# Patient Record
Sex: Female | Born: 1965 | Race: White | Hispanic: No | Marital: Married | Smoking: Never smoker
Health system: Southern US, Community
[De-identification: ages and names within clinical notes are randomized; demographics above are authoritative.]

## PROBLEM LIST (undated history)

## (undated) DIAGNOSIS — D691 Qualitative platelet defects: Secondary | ICD-10-CM

## (undated) DIAGNOSIS — D649 Anemia, unspecified: Secondary | ICD-10-CM

## (undated) DIAGNOSIS — N200 Calculus of kidney: Secondary | ICD-10-CM

## (undated) DIAGNOSIS — B009 Herpesviral infection, unspecified: Secondary | ICD-10-CM

## (undated) DIAGNOSIS — Z9289 Personal history of other medical treatment: Secondary | ICD-10-CM

## (undated) DIAGNOSIS — M1990 Unspecified osteoarthritis, unspecified site: Secondary | ICD-10-CM

## (undated) DIAGNOSIS — Z8489 Family history of other specified conditions: Secondary | ICD-10-CM

## (undated) DIAGNOSIS — B019 Varicella without complication: Secondary | ICD-10-CM

## (undated) HISTORY — DX: Varicella without complication: B01.9

## (undated) HISTORY — DX: Qualitative platelet defects: D69.1

## (undated) HISTORY — DX: Unspecified osteoarthritis, unspecified site: M19.90

## (undated) HISTORY — DX: Herpesviral infection, unspecified: B00.9

## (undated) HISTORY — DX: Calculus of kidney: N20.0

## (undated) HISTORY — PX: FOOT SURGERY: SHX648

## (undated) HISTORY — DX: Personal history of other medical treatment: Z92.89

---
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
## 2007-05-09 ENCOUNTER — Emergency Department: Payer: Self-pay | Admitting: Emergency Medicine

## 2007-06-19 ENCOUNTER — Ambulatory Visit: Payer: Self-pay

## 2008-07-01 ENCOUNTER — Ambulatory Visit: Payer: Self-pay

## 2009-07-05 ENCOUNTER — Ambulatory Visit: Payer: Self-pay

## 2010-07-27 ENCOUNTER — Ambulatory Visit: Payer: Self-pay | Admitting: Podiatry

## 2012-05-02 ENCOUNTER — Ambulatory Visit: Payer: Self-pay | Admitting: Internal Medicine

## 2012-05-02 LAB — IRON AND TIBC
Iron Bind.Cap.(Total): 322 ug/dL (ref 250–450)
Iron: 146 ug/dL (ref 50–170)
Unbound Iron-Bind.Cap.: 176 ug/dL

## 2012-05-02 LAB — CBC CANCER CENTER
Eosinophil #: 0.3 x10 3/mm (ref 0.0–0.7)
Eosinophil %: 3.2 %
HCT: 35.5 % (ref 35.0–47.0)
HGB: 12.1 g/dL (ref 12.0–16.0)
Lymphocyte %: 38.2 %
MCH: 32.4 pg (ref 26.0–34.0)
MCHC: 34 g/dL (ref 32.0–36.0)
MCV: 96 fL (ref 80–100)
Monocyte %: 11 %
Neutrophil %: 46.4 %
RBC: 3.72 10*6/uL — ABNORMAL LOW (ref 3.80–5.20)
RDW: 12.7 % (ref 11.5–14.5)
WBC: 8.4 x10 3/mm (ref 3.6–11.0)

## 2012-05-02 LAB — FERRITIN: Ferritin (ARMC): 73 ng/mL (ref 8–388)

## 2012-05-06 ENCOUNTER — Ambulatory Visit: Payer: Self-pay | Admitting: Internal Medicine

## 2012-05-30 ENCOUNTER — Ambulatory Visit: Payer: Self-pay | Admitting: Obstetrics and Gynecology

## 2012-06-05 ENCOUNTER — Ambulatory Visit: Payer: Self-pay | Admitting: Internal Medicine

## 2012-07-06 ENCOUNTER — Ambulatory Visit: Payer: Self-pay | Admitting: Internal Medicine

## 2012-07-17 LAB — CBC CANCER CENTER
Basophil #: 0.1 x10 3/mm (ref 0.0–0.1)
Basophil %: 1.4 %
Eosinophil %: 4.5 %
HCT: 35.2 % (ref 35.0–47.0)
HGB: 12 g/dL (ref 12.0–16.0)
Lymphocyte #: 3.4 x10 3/mm (ref 1.0–3.6)
Lymphocyte %: 43.6 %
MCH: 32.9 pg (ref 26.0–34.0)
MCHC: 34.2 g/dL (ref 32.0–36.0)
Monocyte #: 0.8 x10 3/mm (ref 0.2–0.9)
Monocyte %: 10.1 %
Neutrophil #: 3.1 x10 3/mm (ref 1.4–6.5)
Neutrophil %: 40.4 %
Platelet: 372 x10 3/mm (ref 150–440)
WBC: 7.8 x10 3/mm (ref 3.6–11.0)

## 2012-08-05 ENCOUNTER — Ambulatory Visit: Payer: Self-pay | Admitting: Internal Medicine

## 2014-02-15 ENCOUNTER — Ambulatory Visit: Payer: Self-pay | Admitting: Obstetrics and Gynecology

## 2014-12-21 ENCOUNTER — Other Ambulatory Visit: Payer: Self-pay | Admitting: Obstetrics and Gynecology

## 2014-12-21 DIAGNOSIS — Z1231 Encounter for screening mammogram for malignant neoplasm of breast: Secondary | ICD-10-CM

## 2015-02-23 ENCOUNTER — Ambulatory Visit
Admission: RE | Admit: 2015-02-23 | Discharge: 2015-02-23 | Disposition: A | Discharge status: Home / Self Care | Payer: BLUE CROSS/BLUE SHIELD | Source: Ambulatory Visit | Attending: Obstetrics and Gynecology | Admitting: Obstetrics and Gynecology

## 2015-02-23 DIAGNOSIS — Z1231 Encounter for screening mammogram for malignant neoplasm of breast: Secondary | ICD-10-CM | POA: Insufficient documentation

## 2016-07-31 DIAGNOSIS — Z124 Encounter for screening for malignant neoplasm of cervix: Secondary | ICD-10-CM | POA: Diagnosis not present

## 2016-07-31 DIAGNOSIS — Z01419 Encounter for gynecological examination (general) (routine) without abnormal findings: Secondary | ICD-10-CM | POA: Diagnosis not present

## 2016-08-01 ENCOUNTER — Other Ambulatory Visit: Payer: Self-pay | Admitting: Obstetrics and Gynecology

## 2016-08-01 DIAGNOSIS — Z1231 Encounter for screening mammogram for malignant neoplasm of breast: Secondary | ICD-10-CM

## 2016-08-14 DIAGNOSIS — Z131 Encounter for screening for diabetes mellitus: Secondary | ICD-10-CM | POA: Diagnosis not present

## 2016-08-14 DIAGNOSIS — Z1321 Encounter for screening for nutritional disorder: Secondary | ICD-10-CM | POA: Diagnosis not present

## 2016-08-14 DIAGNOSIS — Z136 Encounter for screening for cardiovascular disorders: Secondary | ICD-10-CM | POA: Diagnosis not present

## 2016-08-14 DIAGNOSIS — Z13220 Encounter for screening for lipoid disorders: Secondary | ICD-10-CM | POA: Diagnosis not present

## 2016-08-14 DIAGNOSIS — Z1329 Encounter for screening for other suspected endocrine disorder: Secondary | ICD-10-CM | POA: Diagnosis not present

## 2016-08-24 ENCOUNTER — Ambulatory Visit
Admission: RE | Admit: 2016-08-24 | Discharge: 2016-08-24 | Disposition: A | Discharge status: Home / Self Care | Payer: BLUE CROSS/BLUE SHIELD | Source: Ambulatory Visit | Attending: Obstetrics and Gynecology | Admitting: Obstetrics and Gynecology

## 2016-08-24 DIAGNOSIS — Z1231 Encounter for screening mammogram for malignant neoplasm of breast: Secondary | ICD-10-CM | POA: Diagnosis not present

## 2017-03-19 DIAGNOSIS — M530 Cervicocranial syndrome: Secondary | ICD-10-CM | POA: Diagnosis not present

## 2017-03-19 DIAGNOSIS — M533 Sacrococcygeal disorders, not elsewhere classified: Secondary | ICD-10-CM | POA: Diagnosis not present

## 2017-03-19 DIAGNOSIS — M9903 Segmental and somatic dysfunction of lumbar region: Secondary | ICD-10-CM | POA: Diagnosis not present

## 2017-03-26 DIAGNOSIS — M9903 Segmental and somatic dysfunction of lumbar region: Secondary | ICD-10-CM | POA: Diagnosis not present

## 2017-03-26 DIAGNOSIS — M533 Sacrococcygeal disorders, not elsewhere classified: Secondary | ICD-10-CM | POA: Diagnosis not present

## 2017-05-06 DIAGNOSIS — M222X9 Patellofemoral disorders, unspecified knee: Secondary | ICD-10-CM | POA: Diagnosis not present

## 2017-06-26 ENCOUNTER — Other Ambulatory Visit: Payer: Self-pay | Admitting: Orthopedic Surgery

## 2017-07-10 ENCOUNTER — Other Ambulatory Visit: Payer: Self-pay

## 2017-07-10 ENCOUNTER — Encounter
Admission: RE | Admit: 2017-07-10 | Discharge: 2017-07-10 | Disposition: A | Discharge status: Home / Self Care | Payer: BLUE CROSS/BLUE SHIELD | Source: Ambulatory Visit | Attending: Orthopedic Surgery | Admitting: Orthopedic Surgery

## 2017-07-10 HISTORY — DX: Family history of other specified conditions: Z84.89

## 2017-07-10 HISTORY — DX: Anemia, unspecified: D64.9

## 2017-07-10 NOTE — Patient Instructions (Signed)
Your procedure is scheduled on: 07-16-17 TUESDAY Report to Same Day Surgery 2nd floor medical mall [facility](Medical Mall Entrance-take elevator on left to 2nd floor.  Check in with surgery information desk.) To find out your arrival time please call [phone] between 1PM - 3PM on 07-15-17 MONDAY  Remember: Instructions that are not followed completely may result in serious medical risk, up to and including death, or upon the discretion of your surgeon and anesthesiologist your surgery may need to be rescheduled.    _x___ 1. Do not eat food after midnight the night before your procedure. NO GUM OR CANDY AFTER MIDNIGHT.  You may drink clear liquids up to 2 hours before you are scheduled to arrive at the hospital for your procedure.  Do not drink clear liquids within 2 hours of your scheduled arrival to the hospital.  Clear liquids include  --Water or Apple juice without pulp  --Clear carbohydrate beverage such as ClearFast or Gatorade  --Black Coffee or Clear Tea (No milk, no creamers, do not add anything to the coffee or Tea     __x__ 2. No Alcohol for 24 hours before or after surgery.   __x__3. No Smoking or e-cigarettes for 24 prior to surgery.  Do not use any chewable tobacco products for at least 6 hour prior to surgery   ____  4. Bring all medications with you on the day of surgery if instructed.    __x__ 5. Notify your doctor if there is any change in your medical condition     (cold, fever, infections).    x___6. On the morning of surgery brush your teeth with toothpaste and water.  You may rinse your mouth with mouth wash if you wish.  Do not swallow any toothpaste or mouthwash.   Do not wear jewelry, make-up, hairpins, clips or nail polish.  Do not wear lotions, powders, or perfumes. You may wear deodorant.  Do not shave 48 hours prior to surgery. Men may shave face and neck.  Do not bring valuables to the hospital.    [facility] responsible for any belongings or  valuables.               Contacts, dentures or bridgework may not be worn into surgery.  Leave your suitcase in the car. After surgery it may be brought to your room.  For patients admitted to the hospital, discharge time is determined by your treatment team.  _  Patients discharged the day of surgery will not be allowed to drive home.  You will need someone to drive you home and stay with you the night of your procedure.    Please read over the following fact sheets that you were given:   [facility] Pre[facility] and or MRSA Information   ____ Take anti-hypertensive listed below, cardiac, seizure, asthma, anti-reflux and psychiatric medicines. These include:  1. NONE  2.  3.  4.  5.  6.  ____Fleets enema or Magnesium Citrate as directed.   _x___ Use CHG Soap or sage wipes as directed on instruction sheet   ____ Use inhalers on the day of surgery and bring to hospital day of surgery  ____ Stop Metformin and Janumet 2 days prior to surgery.    ____ Take 1/2 of usual insulin dose the night before surgery and none on the morning surgery.   ____ Follow recommendations from Cardiologist, Pulmonologist or PCP regarding stopping Aspirin, Coumadin, Plavix ,Eliquis, Effient, or Pradaxa, and Pletal.  X____Stop  Anti-inflammatories such as Advil, Aleve, Ibuprofen, Motrin, Naproxen, Naprosyn, Goodies powders or aspirin products NOW-OK to take Tylenol    _x___ Stop supplements until after surgery-STOP GLUCOSAMINE NOW-MAY RESUME AFTER SURGERY   ____ Bring C-Pap to the hospital.

## 2017-07-11 ENCOUNTER — Encounter
Admission: RE | Admit: 2017-07-11 | Discharge: 2017-07-11 | Disposition: A | Discharge status: Home / Self Care | Payer: BLUE CROSS/BLUE SHIELD | Source: Ambulatory Visit | Attending: Orthopedic Surgery | Admitting: Orthopedic Surgery

## 2017-07-11 DIAGNOSIS — Z01812 Encounter for preprocedural laboratory examination: Secondary | ICD-10-CM | POA: Insufficient documentation

## 2017-07-11 LAB — CBC WITH DIFFERENTIAL/PLATELET
BASOS PCT: 1 %
Basophils Absolute: 0.1 10*3/uL (ref 0–0.1)
Eosinophils Absolute: 0.2 10*3/uL (ref 0–0.7)
Eosinophils Relative: 3 %
HEMATOCRIT: 35.4 % (ref 35.0–47.0)
Hemoglobin: 12.1 g/dL (ref 12.0–16.0)
LYMPHS ABS: 3.1 10*3/uL (ref 1.0–3.6)
Lymphocytes Relative: 41 %
MCH: 33.4 pg (ref 26.0–34.0)
MCHC: 34.2 g/dL (ref 32.0–36.0)
MCV: 97.5 fL (ref 80.0–100.0)
MONO ABS: 0.9 10*3/uL (ref 0.2–0.9)
MONOS PCT: 12 %
NEUTROS ABS: 3.3 10*3/uL (ref 1.4–6.5)
Neutrophils Relative %: 43 %
Platelets: 409 10*3/uL (ref 150–440)
RBC: 3.63 MIL/uL — ABNORMAL LOW (ref 3.80–5.20)
RDW: 13 % (ref 11.5–14.5)
WBC: 7.6 10*3/uL (ref 3.6–11.0)

## 2017-07-11 LAB — BASIC METABOLIC PANEL
ANION GAP: 6 (ref 5–15)
BUN: 15 mg/dL (ref 6–20)
CALCIUM: 10 mg/dL (ref 8.9–10.3)
CHLORIDE: 104 mmol/L (ref 101–111)
CO2: 26 mmol/L (ref 22–32)
Creatinine, Ser: 0.64 mg/dL (ref 0.44–1.00)
GFR calc Af Amer: >60 mL/min (ref >60)
GFR calc non Af Amer: >60 mL/min (ref >60)
GLUCOSE: 95 mg/dL (ref 65–99)
Potassium: 4.1 mmol/L (ref 3.5–5.1)
Sodium: 136 mmol/L (ref 135–145)

## 2017-07-11 LAB — PROTIME-INR
INR: 0.97
Prothrombin Time: 12.8 seconds (ref 11.4–15.2)

## 2017-07-11 LAB — APTT: aPTT: 28 seconds (ref 24–36)

## 2017-07-15 MED ORDER — CEFAZOLIN SODIUM-DEXTROSE 2-4 GM/100ML-% IV SOLN
2.0000 g | INTRAVENOUS | Status: AC
  Administered 2017-07-16: 2 g via INTRAVENOUS

## 2017-07-16 ENCOUNTER — Encounter: Payer: Self-pay | Admitting: *Deleted

## 2017-07-16 ENCOUNTER — Ambulatory Visit: Payer: BLUE CROSS/BLUE SHIELD | Admitting: Certified Registered"

## 2017-07-16 ENCOUNTER — Encounter
Admission: RE | Disposition: A | Discharge status: Home / Self Care | Payer: Self-pay | Source: Ambulatory Visit | Attending: Orthopedic Surgery

## 2017-07-16 ENCOUNTER — Ambulatory Visit
Admission: RE | Admit: 2017-07-16 | Discharge: 2017-07-16 | Disposition: A | Discharge status: Home / Self Care | Payer: BLUE CROSS/BLUE SHIELD | Source: Ambulatory Visit | Attending: Orthopedic Surgery | Admitting: Orthopedic Surgery

## 2017-07-16 ENCOUNTER — Other Ambulatory Visit: Payer: Self-pay

## 2017-07-16 DIAGNOSIS — M6751 Plica syndrome, right knee: Secondary | ICD-10-CM | POA: Diagnosis not present

## 2017-07-16 DIAGNOSIS — S83241A Other tear of medial meniscus, current injury, right knee, initial encounter: Secondary | ICD-10-CM | POA: Diagnosis not present

## 2017-07-16 DIAGNOSIS — Z791 Long term (current) use of non-steroidal anti-inflammatories (NSAID): Secondary | ICD-10-CM | POA: Insufficient documentation

## 2017-07-16 DIAGNOSIS — M94261 Chondromalacia, right knee: Secondary | ICD-10-CM | POA: Insufficient documentation

## 2017-07-16 DIAGNOSIS — X58XXXA Exposure to other specified factors, initial encounter: Secondary | ICD-10-CM | POA: Diagnosis not present

## 2017-07-16 HISTORY — PX: KNEE ARTHROSCOPY WITH MEDIAL MENISECTOMY: SHX5651

## 2017-07-16 LAB — POCT PREGNANCY, URINE: PREG TEST UR: NEGATIVE

## 2017-07-16 SURGERY — ARTHROSCOPY, KNEE, WITH MEDIAL MENISCECTOMY
Anesthesia: General | Laterality: Right

## 2017-07-16 MED ORDER — MIDAZOLAM HCL 2 MG/2ML IJ SOLN
INTRAMUSCULAR | Status: DC | PRN
  Administered 2017-07-16: 2 mg via INTRAVENOUS

## 2017-07-16 MED ORDER — ASPIRIN EC 325 MG PO TBEC: 325.0000 mg | DELAYED_RELEASE_TABLET | Freq: Every day | ORAL | 0 refills | Status: DC

## 2017-07-16 MED ORDER — FENTANYL CITRATE (PF) 100 MCG/2ML IJ SOLN: 25.0000 ug | INTRAMUSCULAR | Status: DC | PRN

## 2017-07-16 MED ORDER — MIDAZOLAM HCL 2 MG/2ML IJ SOLN
INTRAMUSCULAR | Status: AC
  Filled 2017-07-16: qty 2

## 2017-07-16 MED ORDER — LACTATED RINGERS IV SOLN
INTRAVENOUS | Status: DC
  Administered 2017-07-16: 07:00:00 via INTRAVENOUS

## 2017-07-16 MED ORDER — HYDROCODONE-ACETAMINOPHEN 5-325 MG PO TABS
ORAL_TABLET | ORAL | Status: AC
  Filled 2017-07-16: qty 1

## 2017-07-16 MED ORDER — CEFAZOLIN SODIUM-DEXTROSE 2-4 GM/100ML-% IV SOLN
INTRAVENOUS | Status: AC
  Filled 2017-07-16: qty 100

## 2017-07-16 MED ORDER — LIDOCAINE HCL (CARDIAC) PF 100 MG/5ML IV SOSY
PREFILLED_SYRINGE | INTRAVENOUS | Status: DC | PRN
  Administered 2017-07-16: 100 mg via INTRAVENOUS

## 2017-07-16 MED ORDER — DEXAMETHASONE SODIUM PHOSPHATE 10 MG/ML IJ SOLN
INTRAMUSCULAR | Status: DC | PRN
  Administered 2017-07-16: 6 mg via INTRAVENOUS

## 2017-07-16 MED ORDER — ONDANSETRON HCL 4 MG/2ML IJ SOLN
INTRAMUSCULAR | Status: DC | PRN
  Administered 2017-07-16: 4 mg via INTRAVENOUS

## 2017-07-16 MED ORDER — FAMOTIDINE 20 MG PO TABS
20.0000 mg | ORAL_TABLET | Freq: Once | ORAL | Status: AC
  Administered 2017-07-16: 20 mg via ORAL

## 2017-07-16 MED ORDER — HYDROCODONE-ACETAMINOPHEN 5-325 MG PO TABS
1.0000 | ORAL_TABLET | ORAL | Status: DC | PRN
  Administered 2017-07-16: 1 via ORAL

## 2017-07-16 MED ORDER — CHLORHEXIDINE GLUCONATE CLOTH 2 % EX PADS: 6.0000 | MEDICATED_PAD | Freq: Once | CUTANEOUS | Status: DC

## 2017-07-16 MED ORDER — FAMOTIDINE 20 MG PO TABS
ORAL_TABLET | ORAL | Status: AC
  Administered 2017-07-16: 20 mg via ORAL
  Filled 2017-07-16: qty 1

## 2017-07-16 MED ORDER — FENTANYL CITRATE (PF) 100 MCG/2ML IJ SOLN
INTRAMUSCULAR | Status: DC | PRN
  Administered 2017-07-16: 25 ug via INTRAVENOUS
  Administered 2017-07-16: 50 ug via INTRAVENOUS

## 2017-07-16 MED ORDER — EPHEDRINE SULFATE 50 MG/ML IJ SOLN
INTRAMUSCULAR | Status: AC
  Filled 2017-07-16: qty 1

## 2017-07-16 MED ORDER — EPHEDRINE SULFATE 50 MG/ML IJ SOLN
INTRAMUSCULAR | Status: DC | PRN
  Administered 2017-07-16 (×2): 5 mg via INTRAVENOUS

## 2017-07-16 MED ORDER — LIDOCAINE HCL (PF) 1 % IJ SOLN
INTRAMUSCULAR | Status: AC
Start: 2017-07-16 — End: ?
  Filled 2017-07-16: qty 30

## 2017-07-16 MED ORDER — LIDOCAINE HCL (PF) 1 % IJ SOLN
INTRAMUSCULAR | Status: DC | PRN
  Administered 2017-07-16: 9 mL

## 2017-07-16 MED ORDER — PROPOFOL 10 MG/ML IV BOLUS
INTRAVENOUS | Status: DC | PRN
  Administered 2017-07-16: 170 mg via INTRAVENOUS

## 2017-07-16 MED ORDER — FENTANYL CITRATE (PF) 100 MCG/2ML IJ SOLN
INTRAMUSCULAR | Status: AC
  Filled 2017-07-16: qty 2

## 2017-07-16 MED ORDER — ONDANSETRON HCL 4 MG/2ML IJ SOLN: 4.0000 mg | Freq: Once | INTRAMUSCULAR | Status: DC | PRN

## 2017-07-16 MED ORDER — ONDANSETRON HCL 4 MG PO TABS: 4.0000 mg | ORAL_TABLET | Freq: Three times a day (TID) | ORAL | 0 refills | Status: DC | PRN

## 2017-07-16 MED ORDER — PROPOFOL 10 MG/ML IV BOLUS
INTRAVENOUS | Status: AC
  Filled 2017-07-16: qty 20

## 2017-07-16 MED ORDER — HYDROCODONE-ACETAMINOPHEN 5-325 MG PO TABS: 1.0000 | ORAL_TABLET | ORAL | 0 refills | Status: DC | PRN

## 2017-07-16 MED ORDER — BUPIVACAINE-EPINEPHRINE (PF) 0.25% -1:200000 IJ SOLN
INTRAMUSCULAR | Status: AC
  Filled 2017-07-16: qty 30

## 2017-07-16 SURGICAL SUPPLY — 32 items
BUR RADIUS 3.5 (BURR) ×3 IMPLANT
BUR RADIUS 4.0X18.5 (BURR) ×3 IMPLANT
CLOSURE WOUND 1/2 X4 (GAUZE/BANDAGES/DRESSINGS) ×1
CUFF TOURN 24 STER (MISCELLANEOUS) IMPLANT
CUFF TOURN 30 STER DUAL PORT (MISCELLANEOUS) IMPLANT
DRAPE IMP U-DRAPE 54X76 (DRAPES) ×3 IMPLANT
DURAPREP 26ML APPLICATOR (WOUND CARE) ×9 IMPLANT
GAUZE PETRO XEROFOAM 1X8 (MISCELLANEOUS) ×3 IMPLANT
GAUZE SPONGE 4X4 12PLY STRL (GAUZE/BANDAGES/DRESSINGS) ×3 IMPLANT
GLOVE BIOGEL PI IND STRL 9 (GLOVE) ×1 IMPLANT
GLOVE BIOGEL PI INDICATOR 9 (GLOVE) ×2
GLOVE SURG 9.0 ORTHO LTXF (GLOVE) ×6 IMPLANT
GOWN STRL REUS W/ TWL LRG LVL3 (GOWN DISPOSABLE) ×1 IMPLANT
GOWN STRL REUS W/TWL 2XL LVL3 (GOWN DISPOSABLE) ×3 IMPLANT
GOWN STRL REUS W/TWL LRG LVL3 (GOWN DISPOSABLE) ×2
IV LACTATED RINGER IRRG 3000ML (IV SOLUTION) ×16
IV LR IRRIG 3000ML ARTHROMATIC (IV SOLUTION) ×6 IMPLANT
KIT TURNOVER KIT A (KITS) ×3 IMPLANT
MANIFOLD NEPTUNE II (INSTRUMENTS) ×3 IMPLANT
NEEDLE HYPO 22GX1.5 SAFETY (NEEDLE) ×3 IMPLANT
PACK ARTHROSCOPY KNEE (MISCELLANEOUS) ×3 IMPLANT
PAD ABD DERMACEA PRESS 5X9 (GAUZE/BANDAGES/DRESSINGS) ×6 IMPLANT
SET TUBE SUCT SHAVER OUTFL 24K (TUBING) ×3 IMPLANT
SOL PREP PVP 2OZ (MISCELLANEOUS) ×3
SOLUTION PREP PVP 2OZ (MISCELLANEOUS) ×1 IMPLANT
STRIP CLOSURE SKIN 1/2X4 (GAUZE/BANDAGES/DRESSINGS) ×2 IMPLANT
SUT ETHILON 4-0 (SUTURE) ×2
SUT ETHILON 4-0 FS2 18XMFL BLK (SUTURE) ×1
SUTURE ETHLN 4-0 FS2 18XMF BLK (SUTURE) ×1 IMPLANT
TUBING ARTHRO INFLOW-ONLY STRL (TUBING) ×3 IMPLANT
WAND HAND CNTRL MULTIVAC 50 (MISCELLANEOUS) ×3 IMPLANT
WAND HAND CNTRL MULTIVAC 90 (MISCELLANEOUS) ×3 IMPLANT

## 2017-07-16 NOTE — Discharge Instructions (Signed)

## 2017-07-16 NOTE — Transfer of Care (Signed)
Immediate Anesthesia Transfer of Care Note  Patient: Ariel EdouardShannon H Kole  Procedure(s) Performed: KNEE ARTHROSCOPY WITH MEDIAL MENISECTOMY (Right )  Patient Location: PACU  Anesthesia Type:General  Level of Consciousness: awake, alert  and oriented  Airway & Oxygen Therapy: Patient Spontanous Breathing  Post-op Assessment: Report given to RN and Post -op Vital signs reviewed and stable  Post vital signs: Reviewed and stable  Last Vitals:  Vitals Value Taken Time  BP    Temp    Pulse    Resp    SpO2      Last Pain:  Vitals:   07/16/17 0620  TempSrc: Temporal  PainSc: 0-No pain         Complications: No apparent anesthesia complications

## 2017-07-16 NOTE — Anesthesia Post-op Follow-up Note (Signed)
Anesthesia QCDR form completed.        

## 2017-07-16 NOTE — Anesthesia Postprocedure Evaluation (Signed)
Anesthesia Post Note  Patient: Ariel Payne  Procedure(s) Performed: KNEE ARTHROSCOPY WITH MEDIAL MENISECTOMY (Right )  Patient location during evaluation: PACU Anesthesia Type: General Level of consciousness: awake and alert Pain management: pain level controlled Vital Signs Assessment: post-procedure vital signs reviewed and stable Respiratory status: spontaneous breathing and respiratory function stable Cardiovascular status: stable Anesthetic complications: no     Last Vitals:  Vitals:   07/16/17 0931 07/16/17 0946  BP: 112/66   Pulse: 74 76  Resp: (!) 24 17  Temp:  37.3 C  SpO2: 100% 100%    Last Pain:  Vitals:   07/16/17 0946  TempSrc:   PainSc: 4                  Latandra Loureiro K

## 2017-07-16 NOTE — H&P (Signed)
The patient has been re-examined, and the chart reviewed, and there have been no interval changes to the documented history and physical.    The risks, benefits, and alternatives have been discussed at length, and the patient is willing to proceed.   

## 2017-07-16 NOTE — Anesthesia Procedure Notes (Signed)
Procedure Name: LMA Insertion Date/Time: 07/16/2017 7:37 AM Performed by: Danelle BerryWarr, Joleah Kosak E, CRNA Pre-anesthesia Checklist: Patient identified, Emergency Drugs available, Suction available, Patient being monitored and Timeout performed Patient Re-evaluated:Patient Re-evaluated prior to induction Oxygen Delivery Method: Circle system utilized and Simple face mask Preoxygenation: Pre-oxygenation with 100% oxygen Induction Type: IV induction Ventilation: Mask ventilation without difficulty LMA Size: 4.0 Number of attempts: 1 Placement Confirmation: positive ETCO2 Dental Injury: Teeth and Oropharynx as per pre-operative assessment

## 2017-07-16 NOTE — Op Note (Signed)
PATIENT:  Ariel Payne  PRE-OPERATIVE DIAGNOSIS:  TEAR OF MEDIAL MENISCUS, RIGHT KNEE  POST-OPERATIVE DIAGNOSIS:  Same  PROCEDURE:  RIGHT KNEE ARTHROSCOPY WITH  Partial MEDIAL MENISECTOMY, chondroplasty of the medial femoral condyle and lateral tibial plateau, debridement of medial plica  SURGEON:  Thornton Park, MD  ANESTHESIA:   General  PREOPERATIVE INDICATIONS:  Ariel Payne  52 y.o. female with a diagnosis of TEAR OF MEDIAL MENISCUS, right knee who failed conservative management and elected for surgical management.    The risks benefits and alternatives were discussed with the patient preoperatively including the risks of infection, bleeding, nerve injury, knee stiffness, persistent pain, osteoarthritis and the need for further surgery. Medical  risks include DVT and pulmonary embolism, myocardial infarction, stroke, pneumonia, respiratory failure and death. The patient understood these risks and wished to proceed.  OPERATIVE FINDINGS: Patient had a complex tear involving the posterior horn of the medial meniscus.  There is a large beak shaped tear which could be displaced into the joint with a probe.  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  Patient had chondral flaps of the lateral tibial plateau.  She had mild fraying of the patellofemoral joint.  Had a large erythematous medial plica.  OPERATIVE PROCEDURE: Patient was met in the preoperative area.  Her husband was at the bedside.  Reviewed the operative procedure as well as the postoperative course with them.  The right lower extremity was signed with the word yes and my initials according the hospital's correct site of surgery protocol.  The patient was brought to the operating room where they were placed supine on the operative table. General anesthesia was administered with an LMA. The patient was  prepped and draped in a sterile fashion.  A timeout was performed to verify the patient's name, date of birth, medical record number, correct site of surgery correct procedure to be performed. It was also used to verify the patient received antibiotics that all appropriate instruments, and radiographic studies were available in the room. Once all in attendance were in agreement, the case began.  Proposed arthroscopy incisions were drawn out with a surgical marker. These were pre-injected with 1% lidocaine plain. An 11 blade was used to establish an inferior lateral and inferomedial portals. The inferomedial portal was created using a 18-gauge spinal needle under direct visualization.  A full diagnostic examination of the knee was performed including the suprapatellar pouch, patellofemoral joint, medial lateral compartments as well as the medial lateral gutters, the intercondylar notch in the posterior knee..  Patient had the medial meniscal tear treated with a 4.0 mm resector shaver blade and straight and upward angled duckbill biters. The medial meniscus was debrided until a stable rim was achieved. A chondroplasty of the medial femoral condyle and medial and lateral tibial plateaus was also performed using a 4.0 mm resector shaver blade.  Debridement of the medial plica was also performed using a 4.0 mm resector shaver blade and 90 ArthroCare wand.  The ACL was intact.  The knee was then copiously lavaged. All arthroscopic instruments were removed. The 2 arthroscopy portals were closed with 4-0 nylon. Steri-Strips were applied along with a dry sterile and compressive dressing. The patient was brought to the PACU in stable condition. I was scrubbed and present for the entire case and all sharp and instrument counts were correct at the conclusion the case. I spoke with the patient's  husband postoperatively to let him know the case was performed without complication and the patient was stable in the  recovery room.    Timoteo Gaul, MD

## 2017-07-16 NOTE — Anesthesia Preprocedure Evaluation (Addendum)
Anesthesia Evaluation  Patient identified by MRN, date of birth, ID band  Reviewed: Allergy & Precautions, NPO status , Patient's Chart, lab work & pertinent test results  History of Anesthesia Complications (+) Family history of anesthesia reactionNegative for: history of anesthetic complications (mother with PONV)  Airway Mallampati: II       Dental   Pulmonary neg sleep apnea, neg COPD,           Cardiovascular (-) hypertension(-) Past MI and (-) CHF (-) dysrhythmias (-) Valvular Problems/Murmurs     Neuro/Psych neg Seizures    GI/Hepatic Neg liver ROS, neg GERD  ,  Endo/Other  neg diabetes  Renal/GU negative Renal ROS     Musculoskeletal   Abdominal   Peds  Hematology  (+) anemia ,   Anesthesia Other Findings   Reproductive/Obstetrics                             Anesthesia Physical Anesthesia Plan  ASA: II  Anesthesia Plan: General   Post-op Pain Management:    Induction: Intravenous  PONV Risk Score and Plan: 3 and Dexamethasone, Ondansetron and Midazolam  Airway Management Planned: LMA  Additional Equipment:   Intra-op Plan:   Post-operative Plan:   Informed Consent: I have reviewed the patients History and Physical, chart, labs and discussed the procedure including the risks, benefits and alternatives for the proposed anesthesia with the patient or authorized representative who has indicated his/her understanding and acceptance.     Plan Discussed with:   Anesthesia Plan Comments:         Anesthesia Quick Evaluation

## 2017-11-14 ENCOUNTER — Other Ambulatory Visit: Payer: Self-pay | Admitting: Obstetrics and Gynecology

## 2017-11-14 DIAGNOSIS — Z1231 Encounter for screening mammogram for malignant neoplasm of breast: Secondary | ICD-10-CM

## 2017-12-09 ENCOUNTER — Ambulatory Visit
Admission: RE | Admit: 2017-12-09 | Discharge: 2017-12-09 | Disposition: A | Discharge status: Home / Self Care | Payer: BLUE CROSS/BLUE SHIELD | Source: Ambulatory Visit | Attending: Obstetrics and Gynecology | Admitting: Obstetrics and Gynecology

## 2017-12-09 DIAGNOSIS — Z1231 Encounter for screening mammogram for malignant neoplasm of breast: Secondary | ICD-10-CM | POA: Diagnosis not present

## 2018-05-20 IMAGING — MG MM DIGITAL SCREENING BILAT W/ CAD
4 series · 4 of 4 positions shown · non-contrast
Comparison: Previous exam(s).

CLINICAL DATA: Screening.

EXAM:
DIGITAL SCREENING BILATERAL MAMMOGRAM WITH CAD

[L MLO]
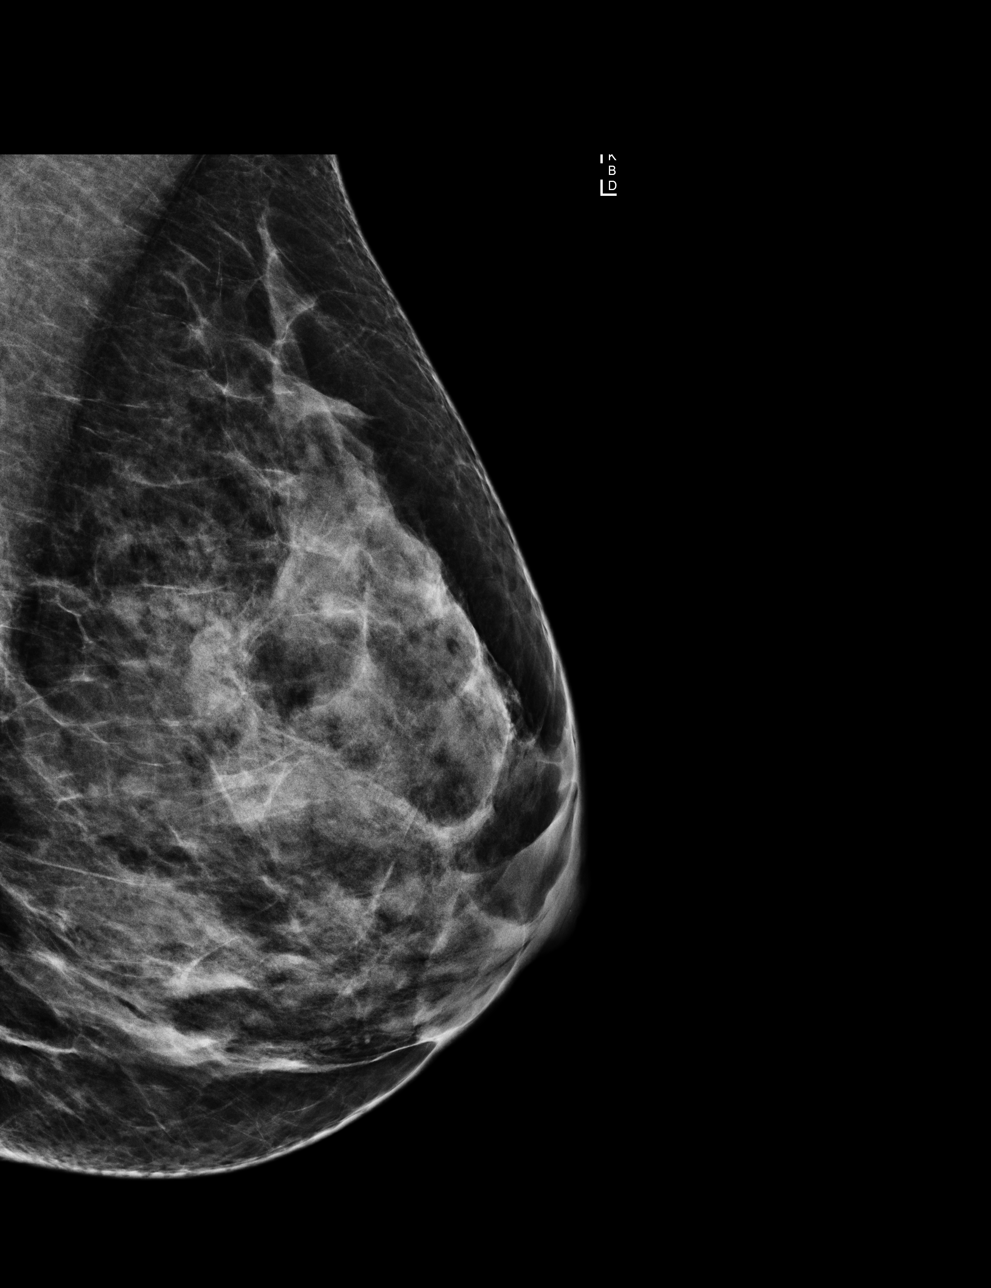

[R MLO]
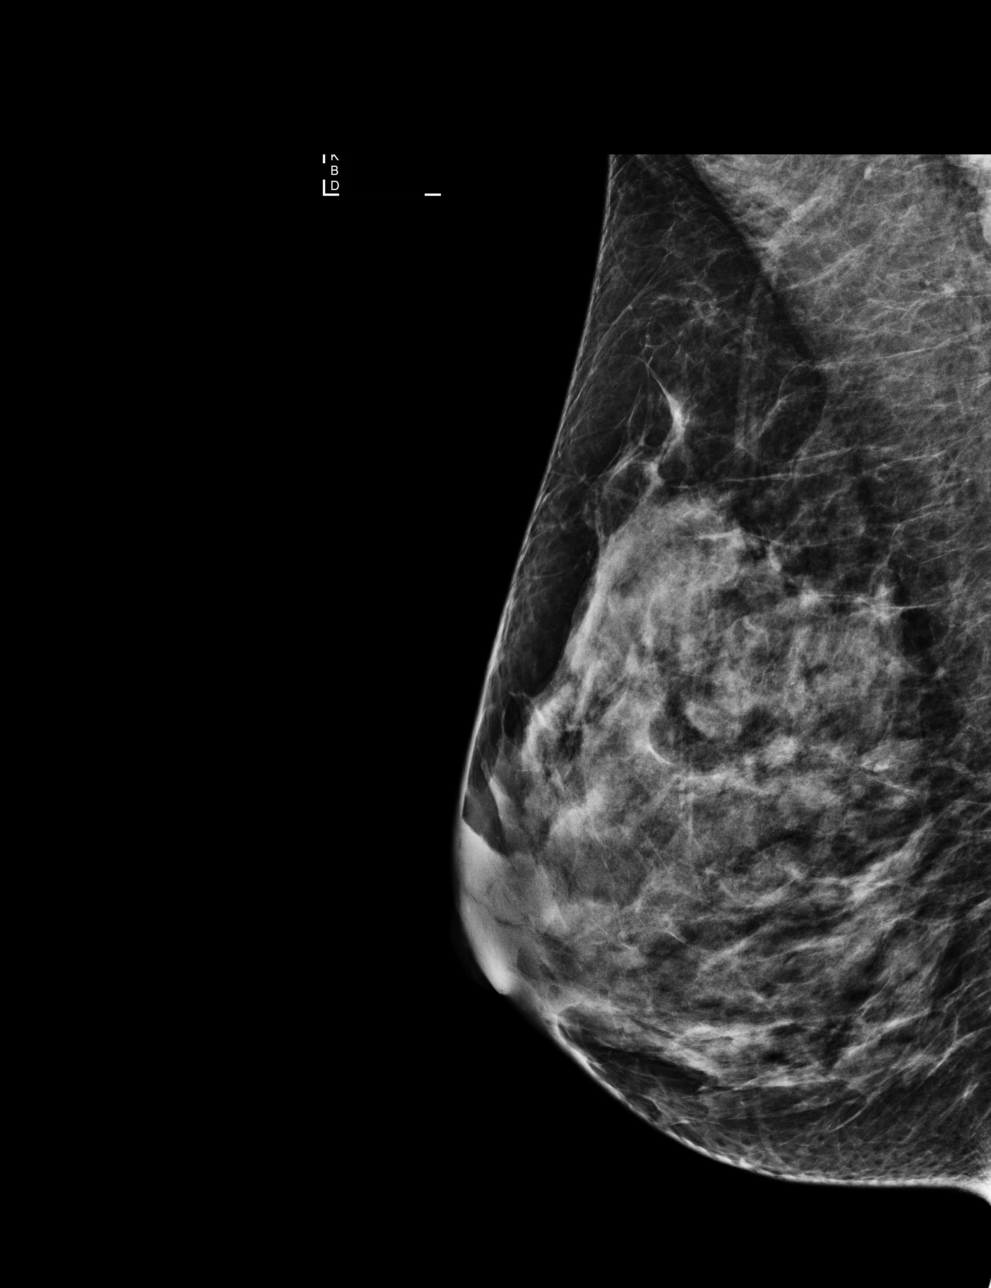

[L CC]
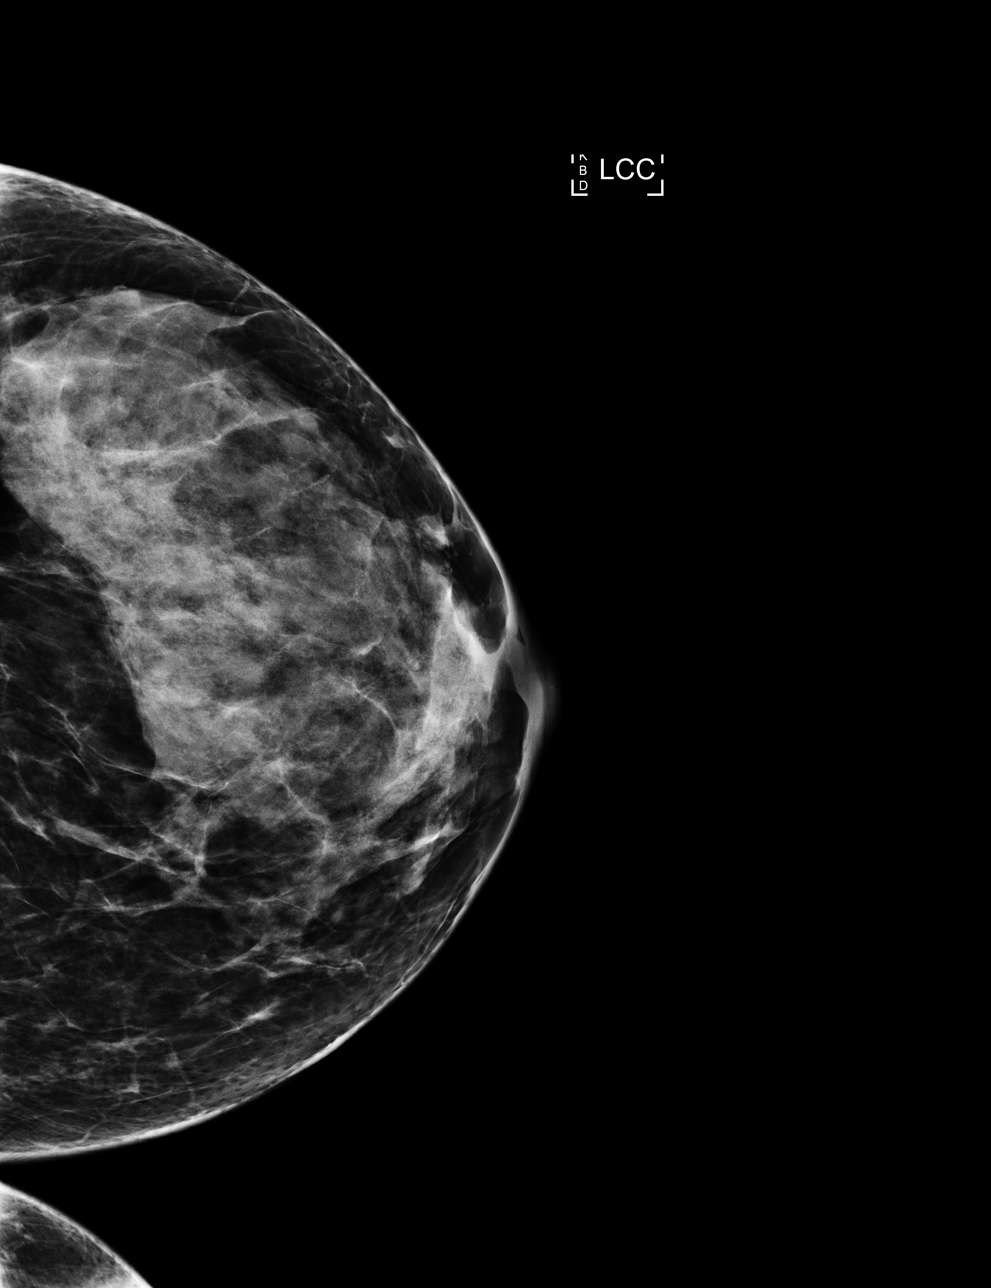

[R CC]
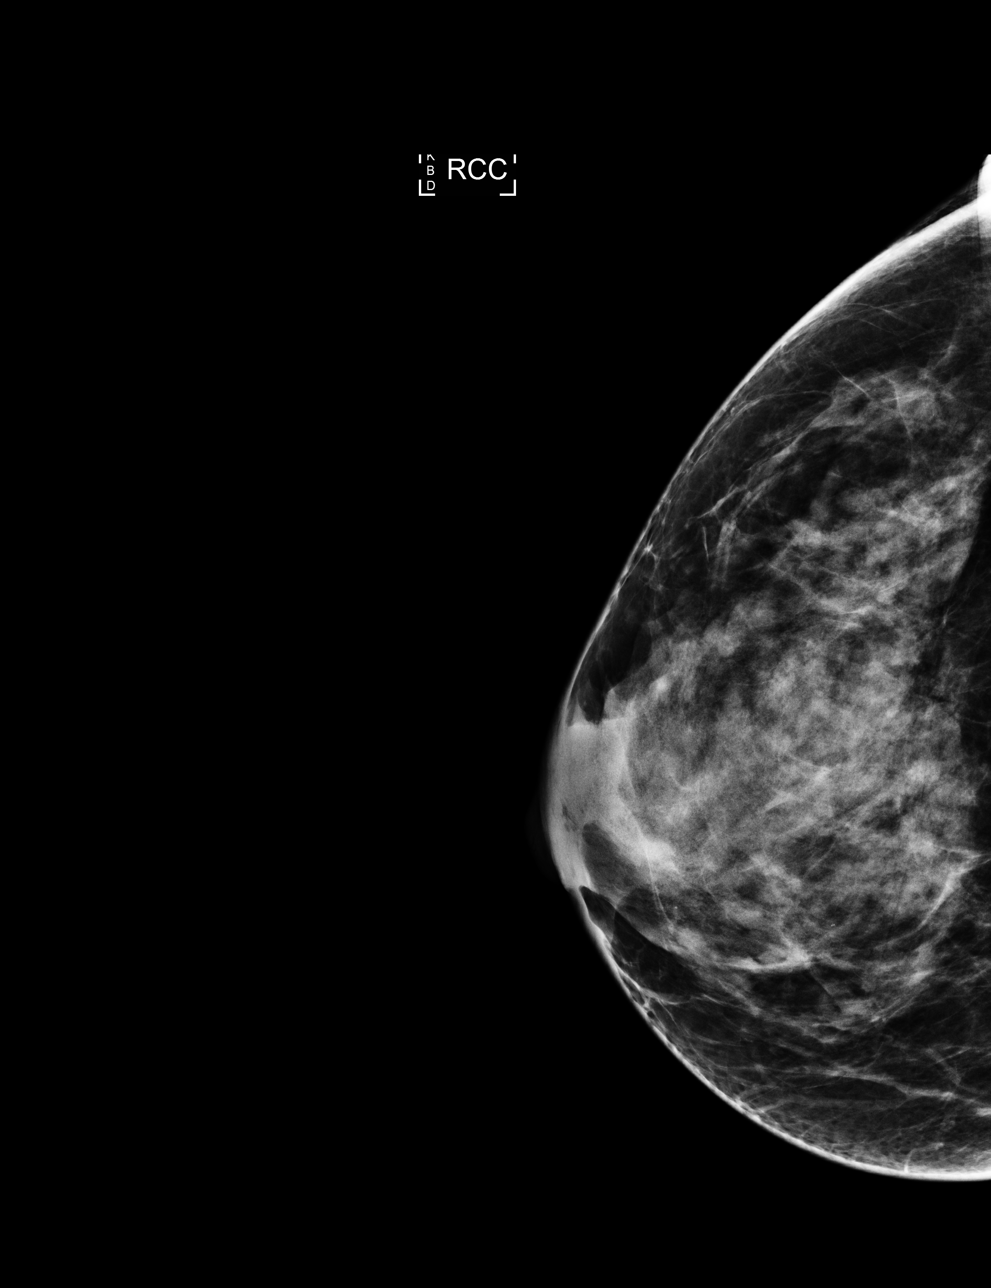

[4 of 4 positions shown; findings below may reference images not displayed]

ACR Breast Density Category d: The breast tissue is extremely dense,
which lowers the sensitivity of mammography.
FINDINGS: There are no findings suspicious for malignancy. Images were
processed with CAD.
IMPRESSION: No mammographic evidence of malignancy. A result letter of this
screening mammogram will be mailed directly to the patient.

RECOMMENDATION:
Screening mammogram in one year. (Code:[id number])

BI-RADS CATEGORY  1: Negative.

## 2018-09-09 DIAGNOSIS — K229 Disease of esophagus, unspecified: Secondary | ICD-10-CM | POA: Diagnosis not present

## 2018-09-24 DIAGNOSIS — Z131 Encounter for screening for diabetes mellitus: Secondary | ICD-10-CM | POA: Diagnosis not present

## 2018-09-24 DIAGNOSIS — Z1321 Encounter for screening for nutritional disorder: Secondary | ICD-10-CM | POA: Diagnosis not present

## 2018-09-24 DIAGNOSIS — Z136 Encounter for screening for cardiovascular disorders: Secondary | ICD-10-CM | POA: Diagnosis not present

## 2018-09-24 DIAGNOSIS — Z13220 Encounter for screening for lipoid disorders: Secondary | ICD-10-CM | POA: Diagnosis not present

## 2018-09-24 DIAGNOSIS — Z01419 Encounter for gynecological examination (general) (routine) without abnormal findings: Secondary | ICD-10-CM | POA: Diagnosis not present

## 2018-09-24 DIAGNOSIS — D229 Melanocytic nevi, unspecified: Secondary | ICD-10-CM | POA: Diagnosis not present

## 2018-09-24 DIAGNOSIS — Z124 Encounter for screening for malignant neoplasm of cervix: Secondary | ICD-10-CM | POA: Diagnosis not present

## 2018-09-24 LAB — HM PAP SMEAR: HM Pap smear: NORMAL

## 2018-10-06 ENCOUNTER — Other Ambulatory Visit: Payer: Self-pay | Admitting: Obstetrics and Gynecology

## 2018-10-06 DIAGNOSIS — Z1231 Encounter for screening mammogram for malignant neoplasm of breast: Secondary | ICD-10-CM

## 2018-10-17 ENCOUNTER — Other Ambulatory Visit: Payer: Self-pay | Admitting: Obstetrics and Gynecology

## 2018-10-17 DIAGNOSIS — Z13820 Encounter for screening for osteoporosis: Secondary | ICD-10-CM

## 2018-11-12 ENCOUNTER — Encounter: Payer: Self-pay | Admitting: *Deleted

## 2018-12-16 ENCOUNTER — Ambulatory Visit
Admission: RE | Admit: 2018-12-16 | Discharge: 2018-12-16 | Disposition: A | Discharge status: Home / Self Care | Payer: BLUE CROSS/BLUE SHIELD | Source: Ambulatory Visit | Attending: Obstetrics and Gynecology | Admitting: Obstetrics and Gynecology

## 2018-12-16 DIAGNOSIS — Z13820 Encounter for screening for osteoporosis: Secondary | ICD-10-CM

## 2018-12-16 DIAGNOSIS — Z1231 Encounter for screening mammogram for malignant neoplasm of breast: Secondary | ICD-10-CM | POA: Insufficient documentation

## 2019-02-23 ENCOUNTER — Telehealth: Payer: Self-pay | Admitting: Internal Medicine

## 2019-02-23 NOTE — Telephone Encounter (Signed)
Pt called wanting to est care with Dr. French Ana . Pt said her husband is Clinical research associate and he is a pt

## 2019-02-24 NOTE — Telephone Encounter (Signed)
Unable to lm to schedule new pt appt  

## 2019-02-24 NOTE — Telephone Encounter (Signed)
Ok to sch  [employer]

## 2019-05-04 ENCOUNTER — Other Ambulatory Visit: Payer: Self-pay

## 2019-05-06 ENCOUNTER — Ambulatory Visit (INDEPENDENT_AMBULATORY_CARE_PROVIDER_SITE_OTHER): Payer: BC Managed Care – PPO | Admitting: Internal Medicine

## 2019-05-06 ENCOUNTER — Encounter: Payer: Self-pay | Admitting: Internal Medicine

## 2019-05-06 ENCOUNTER — Other Ambulatory Visit: Payer: Self-pay

## 2019-05-06 VITALS — BP 114/76 | HR 79 | Temp 97.6°F | Ht 65.0 in | Wt 133.4 lb

## 2019-05-06 DIAGNOSIS — Z1211 Encounter for screening for malignant neoplasm of colon: Secondary | ICD-10-CM

## 2019-05-06 DIAGNOSIS — Z0000 Encounter for general adult medical examination without abnormal findings: Secondary | ICD-10-CM

## 2019-05-06 DIAGNOSIS — E785 Hyperlipidemia, unspecified: Secondary | ICD-10-CM

## 2019-05-06 DIAGNOSIS — E611 Iron deficiency: Secondary | ICD-10-CM

## 2019-05-06 DIAGNOSIS — Z1231 Encounter for screening mammogram for malignant neoplasm of breast: Secondary | ICD-10-CM

## 2019-05-06 DIAGNOSIS — Z1389 Encounter for screening for other disorder: Secondary | ICD-10-CM

## 2019-05-06 DIAGNOSIS — Z9081 Acquired absence of spleen: Secondary | ICD-10-CM | POA: Insufficient documentation

## 2019-05-06 DIAGNOSIS — Z1329 Encounter for screening for other suspected endocrine disorder: Secondary | ICD-10-CM

## 2019-05-06 DIAGNOSIS — R232 Flushing: Secondary | ICD-10-CM | POA: Insufficient documentation

## 2019-05-06 DIAGNOSIS — E538 Deficiency of other specified B group vitamins: Secondary | ICD-10-CM

## 2019-05-06 DIAGNOSIS — E559 Vitamin D deficiency, unspecified: Secondary | ICD-10-CM | POA: Insufficient documentation

## 2019-05-06 DIAGNOSIS — B009 Herpesviral infection, unspecified: Secondary | ICD-10-CM | POA: Diagnosis not present

## 2019-05-06 MED ORDER — VALACYCLOVIR HCL 1 G PO TABS: 1000.0000 mg | ORAL_TABLET | Freq: Two times a day (BID) | ORAL | 5 refills | Status: DC

## 2019-05-06 MED ORDER — DENAVIR 1 % EX CREA: 1.0000 "application " | TOPICAL_CREAM | CUTANEOUS | 11 refills | Status: DC
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
## 2019-05-06 NOTE — Patient Instructions (Addendum)
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 The spleen is an organ that is located in the upper left part of the abdomen, just under the ribs. The spleen filters and cleans the blood. It also stores blood cells and destroys cells that are old. The spleen, along with other body systems and organs, plays an important role in the body's natural disease-fighting system (immune system). Not having a spleen may affect your body's ability to fight infections. After the spleen is removed, you have a slightly greater chance of developing a serious, life-threatening infection. The following are some actions that you can take to prevent infection. How can I prevent infection? Your health care provider will recommend actions to help prevent infection. These may include:  Making sure that your immunizations are up to date, including: ? Pneumococcus. ? Seasonal flu (influenza). ? Hib (Haemophilus influenzae type b). ? Meningitis.  Making sure that vaccines are up to date for your family members.  Following good daily practices to prevent infection, such as: ? Washing your hands often, especially after preparing food, eating, changing diapers, and playing with children or animals. ? Disinfecting surfaces regularly. ? Avoiding people who have active illness or infections.  Taking precautions to avoid insect bites, such as: ? Wearing proper clothing that covers the entire body when you are in wooded or marshy areas. ? Changing clothing right away and checking for bites after you have been outside. ? Using insect spray. ? Using insect netting. ? Staying indoors during  hours when mosquitoes are most active.  Taking precautions to avoid dog bites. ? After a splenectomy, you may be at increased risk for rare infections that are associated with dog bites. What do I need to do if I must travel? If you travel in the [place], take actions to avoid insect bites, especially in [place] and [place] coastal areas. Insects can carry many viruses, and you may be at an increased risk of becoming sick from these viruses. You should also take precautions if you travel abroad to places where malaria is common. In that case, follow these guidelines:  Contact your health care provider to get specific advice about the places that you will be visiting.  Get specific immunizations to guard against the disease risks in the country that you will be visiting.  Understand how to prevent infections, such as malaria, while you are abroad. These infections can pose serious risk. Precautions may include: ? Daily tablets to prevent malaria. ? Taking other precautions to prevent insect bites.  Bring broad-spectrum antibiotic medicines with you if they have been prescribed. What other things do I need to remember to do?   Take over-the-counter and prescription medicines only as told by your health care provider.  If you were prescribed an antibiotic medicine: ? Take it as told by your health care provider. ? Do not stop taking the antibiotic even if you start to feel better. ? Talk with your health care provider about using a probiotic supplement to prevent stomach upset.  Keep track of medicine refills so you do not run out of medicine.  Always tell your health care providers that  you do not have a spleen before you have any procedures. These include medical and dental procedures.  Inform your close contacts of your condition. Consider wearing a medical alert bracelet or carrying an ID card.  Keep all follow-up visits as told by your health care provider. This is  important. Contact a health care provider if:  You have a fever.  You have signs of infection that continue after taking an antibiotic. Signs may include a fever, chills, and feeling unwell.  You are considering travel abroad.  You are bitten by a tick or a dog. Get help right away if:  You have chest pain along with: ? Shortness of breath. ? Pain in the back, neck, or jaw.  You have pain or swelling in the leg.  You develop a sudden headache and dizziness. Summary  The spleen plays an important role in fighting disease and infections. If you have had your spleen surgically removed, you should take steps to help prevent infections.  Make sure that your vaccinations are up to date.  Follow good daily practices to prevent infection, such as washing your hands often and avoiding people who are sick.  Make sure that your family members, others who are close to you, and all of your health care providers know that your spleen has been removed.  Contact a health care provider if you have a fever or any signs of infection. This information is not intended to replace advice given to you by your health care provider. Make sure you discuss any questions you have with your health care provider. Document Revised: 11/21/2017 Document Reviewed: 11/21/2017 Elsevier Patient Education  2020 Elsevier Inc.  Zoster Vaccine, Recombinant injection What is this medicine? ZOSTER VACCINE (ZOS ter vak SEEN) is used to prevent shingles in adults 52 years old and over. This vaccine is not used to treat shingles or nerve pain from shingles. This medicine may be used for other purposes; ask your health care provider or pharmacist if you have questions. COMMON BRAND NAME(S): [facility] What should I tell my health care provider before I take this medicine? They need to know if you have any of these conditions:  blood disorders or disease  cancer like leukemia or lymphoma  immune system problems or  therapy  an unusual or allergic reaction to vaccines, other medications, foods, dyes, or preservatives  pregnant or trying to get pregnant  breast-feeding How should I use this medicine? This vaccine is for injection in a muscle. It is given by a health care professional. Talk to your pediatrician regarding the use of this medicine in children. This medicine is not approved for use in children. Overdosage: If you think you have taken too much of this medicine contact a poison control center or emergency room at once. NOTE: This medicine is only for you. Do not share this medicine with others. What if I miss a dose? Keep appointments for follow-up (booster) doses as directed. It is important not to miss your dose. Call your doctor or health care professional if you are unable to keep an appointment. What may interact with this medicine?  medicines that suppress your immune system  medicines to treat cancer  steroid medicines like prednisone or cortisone This list may not describe all possible interactions. Give your health care provider a list of all the medicines, herbs, non-prescription drugs, or dietary supplements you use. Also tell them if you smoke, drink alcohol, or use illegal drugs. Some items may interact with your medicine.  What should I watch for while using this medicine? Visit your doctor for regular check ups. This vaccine, like all vaccines, may not fully protect everyone. What side effects may I notice from receiving this medicine? Side effects that you should report to your doctor or health care professional as soon as possible:  allergic reactions like skin rash, itching or hives, swelling of the face, lips, or tongue  breathing problems Side effects that usually do not require medical attention (report these to your doctor or health care professional if they continue or are bothersome):  chills  headache  fever  nausea, vomiting  redness, warmth, pain,  swelling or itching at site where injected  tiredness This list may not describe all possible side effects. Call your doctor for medical advice about side effects. You may report side effects to FDA at 1-800-FDA-1088. Where should I keep my medicine? This vaccine is only given in a clinic, pharmacy, doctor's office, or other health care setting and will not be stored at home. NOTE: This sheet is a summary. It may not cover all possible information. If you have questions about this medicine, talk to your doctor, pharmacist, or health care provider.  2020 Elsevier/Gold Standard (2016-09-03 13:20:30)  https://www.cdc.gov/vaccines/hcp/vis/vis-statements/tdap.pdf">  Tdap (Tetanus, Diphtheria, Pertussis) Vaccine: What You Need to Know 1. Why get vaccinated? Tdap vaccine can prevent tetanus, diphtheria, and pertussis. Diphtheria and pertussis spread from person to person. Tetanus enters the body through cuts or wounds.  TETANUS (T) causes painful stiffening of the muscles. Tetanus can lead to serious health problems, including being unable to open the mouth, having trouble swallowing and breathing, or death.  DIPHTHERIA (D) can lead to difficulty breathing, heart failure, paralysis, or death.  PERTUSSIS (aP), also known as "whooping cough," can cause uncontrollable, violent coughing which makes it hard to breathe, eat, or drink. Pertussis can be extremely serious in babies and young children, causing pneumonia, convulsions, brain damage, or death. In teens and adults, it can cause weight loss, loss of bladder control, passing out, and rib fractures from severe coughing. 2. Tdap vaccine Tdap is only for children 7 years and older, adolescents, and adults.  Adolescents should receive a single dose of Tdap, preferably at age 32 or 12 years. Pregnant women should get a dose of Tdap during every pregnancy, to protect the newborn from pertussis. Infants are most at risk for severe, life-threatening  complications from pertussis. Adults who have never received Tdap should get a dose of Tdap. Also, adults should receive a booster dose every 10 years, or earlier in the case of a severe and dirty wound or burn. Booster doses can be either Tdap or Td (a different vaccine that protects against tetanus and diphtheria but not pertussis). Tdap may be given at the same time as other vaccines. 3. Talk with your health care provider Tell your vaccine provider if the person getting the vaccine:  Has had an allergic reaction after a previous dose of any vaccine that protects against tetanus, diphtheria, or pertussis, or has any severe, life-threatening allergies.  Has had a coma, decreased level of consciousness, or prolonged seizures within 7 days after a previous dose of any pertussis vaccine (DTP, DTaP, or Tdap).  Has seizures or another nervous system problem.  Has ever had Guillain-Barr Syndrome (also called GBS).  Has had severe pain or swelling after a previous dose of any vaccine that protects against tetanus or diphtheria. In some cases, your health care provider may decide to postpone Tdap vaccination  to a future visit.  People with minor illnesses, such as a cold, may be vaccinated. People who are moderately or severely ill should usually wait until they recover before getting Tdap vaccine.  Your health care provider can give you more information. 4. Risks of a vaccine reaction  Pain, redness, or swelling where the shot was given, mild fever, headache, feeling tired, and nausea, vomiting, diarrhea, or stomachache sometimes happen after Tdap vaccine. People sometimes faint after medical procedures, including vaccination. Tell your provider if you feel dizzy or have vision changes or ringing in the ears.  As with any medicine, there is a very remote chance of a vaccine causing a severe allergic reaction, other serious injury, or death. 5. What if there is a serious problem? An allergic  reaction could occur after the vaccinated person leaves the clinic. If you see signs of a severe allergic reaction (hives, swelling of the face and throat, difficulty breathing, a fast heartbeat, dizziness, or weakness), call 9-1-1 and get the person to the nearest hospital. For other signs that concern you, call your health care provider.  Adverse reactions should be reported to the Vaccine Adverse Event Reporting System (VAERS). Your health care provider will usually file this report, or you can do it yourself. Visit the VAERS website at www.vaers.LAgents.no or call 757-096-6214. VAERS is only for reporting reactions, and VAERS staff do not give medical advice. 6. The National Vaccine Injury Compensation Program The [employer] Vaccine Injury Compensation Program (VICP) is a federal program that was created to compensate people who may have been injured by certain vaccines. Visit the VICP website at SpiritualWord.at or call 850-370-6641 to learn about the program and about filing a claim. There is a time limit to file a claim for compensation. 7. How can I learn more?  Ask your health care provider.  Call your local or state health department.  Contact the Centers for Disease Control and Prevention (CDC): ? Call (530) 766-7349 (1-800-CDC-INFO) or ? Visit CDC's website at PicCapture.uy Vaccine Information Statement Tdap (Tetanus, Diphtheria, Pertussis) Vaccine (05/07/2018) This information is not intended to replace advice given to you by your health care provider. Make sure you discuss any questions you have with your health care provider. Document Revised: 05/16/2018 Document Reviewed: 05/19/2018 Elsevier Patient Education  2020 Elsevier Inc.    Menopause Menopause is the normal time of life when menstrual periods stop completely. It is usually confirmed by 12 months without a menstrual period. The transition to menopause (perimenopause) most often happens between the  ages of 66 and 35. During perimenopause, hormone levels change in your body, which can cause symptoms and affect your health. Menopause may increase your risk for:  Loss of bone (osteoporosis), which causes bone breaks (fractures).  Depression.  Hardening and narrowing of the arteries (atherosclerosis), which can cause heart attacks and strokes. What are the causes? This condition is usually caused by a natural change in hormone levels that happens as you get older. The condition may also be caused by surgery to remove both ovaries (bilateral oophorectomy). What increases the risk? This condition is more likely to start at an earlier age if you have certain medical conditions or treatments, including:  A tumor of the pituitary gland in the brain.  A disease that affects the ovaries and hormone production.  Radiation treatment for cancer.  Certain cancer treatments, such as chemotherapy or hormone (anti-estrogen) therapy.  Heavy smoking and excessive alcohol use.  Family history of early menopause. This condition is also  more likely to develop earlier in women who are very thin. What are the signs or symptoms? Symptoms of this condition include:  Hot flashes.  Irregular menstrual periods.  Night sweats.  Changes in feelings about sex. This could be a decrease in sex drive or an increased comfort around your sexuality.  Vaginal dryness and thinning of the vaginal walls. This may cause painful intercourse.  Dryness of the skin and development of wrinkles.  Headaches.  Problems sleeping (insomnia).  Mood swings or irritability.  Memory problems.  Weight gain.  Hair growth on the face and chest.  Bladder infections or problems with urinating. How is this diagnosed? This condition is diagnosed based on your medical history, a physical exam, your age, your menstrual history, and your symptoms. Hormone tests may also be done. How is this treated? In some cases, no  treatment is needed. You and your health care provider should make a decision together about whether treatment is necessary. Treatment will be based on your individual condition and preferences. Treatment for this condition focuses on managing symptoms. Treatment may include:  Menopausal hormone therapy (MHT).  Medicines to treat specific symptoms or complications.  Acupuncture.  Vitamin or herbal supplements. Before starting treatment, make sure to let your health care provider know if you have a personal or family history of:  Heart disease.  Breast cancer.  Blood clots.  Diabetes.  Osteoporosis. Follow these instructions at home: Lifestyle  Do not use any products that contain nicotine or tobacco, such as cigarettes and e-cigarettes. If you need help quitting, ask your health care provider.  Get at least 30 minutes of physical activity on 5 or more days each week.  Avoid alcoholic and caffeinated beverages, as well as spicy foods. This may help prevent hot flashes.  Get 7-8 hours of sleep each night.  If you have hot flashes, try: ? Dressing in layers. ? Avoiding things that may trigger hot flashes, such as spicy food, warm places, or stress. ? Taking slow, deep breaths when a hot flash starts. ? Keeping a fan in your home and office.  Find ways to manage stress, such as deep breathing, meditation, or journaling.  Consider going to group therapy with other women who are having menopause symptoms. Ask your health care provider about recommended group therapy meetings. Eating and drinking  Eat a healthy, balanced diet that contains whole grains, lean protein, low-fat dairy, and plenty of fruits and vegetables.  Your health care provider may recommend adding more soy to your diet. Foods that contain soy include tofu, tempeh, and soy milk.  Eat plenty of foods that contain calcium and vitamin D for bone health. Items that are rich in calcium include low-fat milk, yogurt,  beans, almonds, sardines, broccoli, and kale. Medicines  Take over-the-counter and prescription medicines only as told by your health care provider.  Talk with your health care provider before starting any herbal supplements. If prescribed, take vitamins and supplements as told by your health care provider. These may include: ? Calcium. Women age 5 and older should get 1,200 mg (milligrams) of calcium every day. ? Vitamin D. Women need 600-800 International Units of vitamin D each day. ? Vitamins B12 and B6. Aim for 50 micrograms of B12 and 1.5 mg of B6 each day. General instructions  Keep track of your menstrual periods, including: ? When they occur. ? How heavy they are and how long they last. ? How much time passes between periods.  Keep track of your  symptoms, noting when they start, how often you have them, and how long they last.  Use vaginal lubricants or moisturizers to help with vaginal dryness and improve comfort during sex.  Keep all follow-up visits as told by your health care provider. This is important. This includes any group therapy or counseling. Contact a health care provider if:  You are still having menstrual periods after age 455.  You have pain during sex.  You have not had a period for 12 months and you develop vaginal bleeding. Get help right away if:  You have: ? Severe depression. ? Excessive vaginal bleeding. ? Pain when you urinate. ? A fast or irregular heart beat (palpitations). ? Severe headaches. ? Abdomen (abdominal) pain or severe indigestion.  You fell and you think you have a broken bone.  You develop leg or chest pain.  You develop vision problems.  You feel a lump in your breast. Summary  Menopause is the normal time of life when menstrual periods stop completely. It is usually confirmed by 12 months without a menstrual period.  The transition to menopause (perimenopause) most often happens between the ages of 5345 and  1855.  Symptoms can be managed through medicines, lifestyle changes, and complementary therapies such as acupuncture.  Eat a balanced diet that is rich in nutrients to promote bone health and heart health and to manage symptoms during menopause. This information is not intended to replace advice given to you by your health care provider. Make sure you discuss any questions you have with your health care provider. Document Revised: 01/04/2017 Document Reviewed: 02/25/2016 Elsevier Patient Education  2020 [employer]

## 2019-05-27 DIAGNOSIS — L989 Disorder of the skin and subcutaneous tissue, unspecified: Secondary | ICD-10-CM | POA: Diagnosis not present

## 2019-05-27 DIAGNOSIS — D487 Neoplasm of uncertain behavior of other specified sites: Secondary | ICD-10-CM | POA: Diagnosis not present

## 2019-06-10 ENCOUNTER — Encounter: Payer: Self-pay | Admitting: Internal Medicine

## 2019-06-10 NOTE — Progress Notes (Unsigned)
Received pap results will place on Dr Shauna Hugh desk

## 2019-06-11 ENCOUNTER — Encounter: Payer: Self-pay | Admitting: Internal Medicine

## 2019-07-03 ENCOUNTER — Encounter: Payer: Self-pay | Admitting: Internal Medicine

## 2019-07-15 ENCOUNTER — Other Ambulatory Visit (HOSPITAL_COMMUNITY): Payer: Self-pay | Admitting: Podiatry

## 2019-07-15 ENCOUNTER — Other Ambulatory Visit: Payer: Self-pay | Admitting: Podiatry

## 2019-07-27 DIAGNOSIS — Z1211 Encounter for screening for malignant neoplasm of colon: Secondary | ICD-10-CM | POA: Diagnosis not present

## 2019-07-27 DIAGNOSIS — Z01818 Encounter for other preprocedural examination: Secondary | ICD-10-CM | POA: Diagnosis not present

## 2019-12-15 DIAGNOSIS — Z01818 Encounter for other preprocedural examination: Secondary | ICD-10-CM | POA: Diagnosis not present

## 2019-12-18 DIAGNOSIS — Z1211 Encounter for screening for malignant neoplasm of colon: Secondary | ICD-10-CM | POA: Diagnosis not present

## 2019-12-18 DIAGNOSIS — K640 First degree hemorrhoids: Secondary | ICD-10-CM | POA: Diagnosis not present

## 2019-12-18 LAB — HM COLONOSCOPY

## 2020-04-12 ENCOUNTER — Encounter: Payer: Self-pay | Admitting: Internal Medicine

## 2020-05-03 DIAGNOSIS — Q141 Congenital malformation of retina: Secondary | ICD-10-CM | POA: Diagnosis not present

## 2020-05-11 ENCOUNTER — Other Ambulatory Visit: Payer: Self-pay | Admitting: Internal Medicine

## 2020-05-11 DIAGNOSIS — B009 Herpesviral infection, unspecified: Secondary | ICD-10-CM

## 2020-05-19 ENCOUNTER — Emergency Department: Payer: BC Managed Care – PPO

## 2020-05-19 ENCOUNTER — Encounter: Payer: Self-pay | Admitting: Emergency Medicine

## 2020-05-19 ENCOUNTER — Telehealth: Payer: Self-pay | Admitting: Urology

## 2020-05-19 ENCOUNTER — Other Ambulatory Visit: Payer: Self-pay

## 2020-05-19 ENCOUNTER — Encounter
Admission: EM | Disposition: A | Discharge status: Home / Self Care | Payer: Self-pay | Source: Home / Self Care | Attending: Emergency Medicine

## 2020-05-19 ENCOUNTER — Ambulatory Visit
Admission: EM | Admit: 2020-05-19 | Discharge: 2020-05-19 | Disposition: A | Discharge status: Home / Self Care | Payer: BC Managed Care – PPO | Attending: Emergency Medicine | Admitting: Emergency Medicine

## 2020-05-19 DIAGNOSIS — R109 Unspecified abdominal pain: Secondary | ICD-10-CM | POA: Diagnosis not present

## 2020-05-19 DIAGNOSIS — Z9081 Acquired absence of spleen: Secondary | ICD-10-CM | POA: Insufficient documentation

## 2020-05-19 DIAGNOSIS — N200 Calculus of kidney: Secondary | ICD-10-CM | POA: Diagnosis not present

## 2020-05-19 DIAGNOSIS — I700 Atherosclerosis of aorta: Secondary | ICD-10-CM | POA: Diagnosis not present

## 2020-05-19 DIAGNOSIS — N201 Calculus of ureter: Secondary | ICD-10-CM | POA: Diagnosis not present

## 2020-05-19 DIAGNOSIS — Z79899 Other long term (current) drug therapy: Secondary | ICD-10-CM | POA: Diagnosis not present

## 2020-05-19 DIAGNOSIS — R110 Nausea: Secondary | ICD-10-CM

## 2020-05-19 DIAGNOSIS — I878 Other specified disorders of veins: Secondary | ICD-10-CM | POA: Diagnosis not present

## 2020-05-19 DIAGNOSIS — N132 Hydronephrosis with renal and ureteral calculous obstruction: Secondary | ICD-10-CM | POA: Diagnosis not present

## 2020-05-19 DIAGNOSIS — M545 Low back pain, unspecified: Secondary | ICD-10-CM | POA: Diagnosis not present

## 2020-05-19 HISTORY — PX: EXTRACORPOREAL SHOCK WAVE LITHOTRIPSY: SHX1557

## 2020-05-19 LAB — COMPREHENSIVE METABOLIC PANEL
ALT: 12 U/L (ref 0–44)
AST: 24 U/L (ref 15–41)
Albumin: 4.1 g/dL (ref 3.5–5.0)
Alkaline Phosphatase: 54 U/L (ref 38–126)
Anion gap: 10 (ref 5–15)
BUN: 19 mg/dL (ref 6–20)
CO2: 23 mmol/L (ref 22–32)
Calcium: 9.1 mg/dL (ref 8.9–10.3)
Chloride: 104 mmol/L (ref 98–111)
Creatinine, Ser: 0.84 mg/dL (ref 0.44–1.00)
GFR, Estimated: >60 mL/min (ref >60)
Glucose, Bld: 142 mg/dL — ABNORMAL HIGH (ref 70–99)
Potassium: 3.7 mmol/L (ref 3.5–5.1)
Sodium: 137 mmol/L (ref 135–145)
Total Bilirubin: 0.8 mg/dL (ref 0.3–1.2)
Total Protein: 7.7 g/dL (ref 6.5–8.1)

## 2020-05-19 LAB — CBC WITH DIFFERENTIAL/PLATELET
Abs Immature Granulocytes: 0.04 10*3/uL (ref 0.00–0.07)
Basophils Absolute: 0.1 10*3/uL (ref 0.0–0.1)
Basophils Relative: 1 %
Eosinophils Absolute: 0.1 10*3/uL (ref 0.0–0.5)
Eosinophils Relative: 1 %
HCT: 35.9 % — ABNORMAL LOW (ref 36.0–46.0)
Hemoglobin: 12.2 g/dL (ref 12.0–15.0)
Immature Granulocytes: 0 %
Lymphocytes Relative: 25 %
Lymphs Abs: 3.5 10*3/uL (ref 0.7–4.0)
MCH: 33.6 pg (ref 26.0–34.0)
MCHC: 34 g/dL (ref 30.0–36.0)
MCV: 98.9 fL (ref 80.0–100.0)
Monocytes Absolute: 1 10*3/uL (ref 0.1–1.0)
Monocytes Relative: 7 %
Neutro Abs: 9.2 10*3/uL — ABNORMAL HIGH (ref 1.7–7.7)
Neutrophils Relative %: 66 %
Platelets: 381 10*3/uL (ref 150–400)
RBC: 3.63 MIL/uL — ABNORMAL LOW (ref 3.87–5.11)
RDW: 13.1 % (ref 11.5–15.5)
WBC: 13.9 10*3/uL — ABNORMAL HIGH (ref 4.0–10.5)
nRBC: 0 % (ref 0.0–0.2)

## 2020-05-19 LAB — URINALYSIS, COMPLETE (UACMP) WITH MICROSCOPIC
Bilirubin Urine: NEGATIVE
Glucose, UA: NEGATIVE mg/dL
Ketones, ur: 5 mg/dL — AB
Leukocytes,Ua: NEGATIVE
Nitrite: NEGATIVE
Protein, ur: 30 mg/dL — AB
RBC / HPF: >50 RBC/hpf — ABNORMAL HIGH (ref 0–5)
Specific Gravity, Urine: 1.029 (ref 1.005–1.030)
pH: 5 (ref 5.0–8.0)

## 2020-05-19 LAB — LIPASE, BLOOD: Lipase: 30 U/L (ref 11–51)

## 2020-05-19 LAB — PREGNANCY, URINE: Preg Test, Ur: NEGATIVE

## 2020-05-19 SURGERY — LITHOTRIPSY, ESWL
Anesthesia: Choice | Laterality: Left

## 2020-05-19 MED ORDER — CEPHALEXIN 500 MG PO CAPS
ORAL_CAPSULE | ORAL | Status: AC
  Administered 2020-05-19: 500 mg via ORAL
  Filled 2020-05-19: qty 1

## 2020-05-19 MED ORDER — HYDROCODONE-ACETAMINOPHEN 5-325 MG PO TABS: 1.0000 | ORAL_TABLET | ORAL | 0 refills | Status: AC | PRN

## 2020-05-19 MED ORDER — MORPHINE SULFATE (PF) 4 MG/ML IV SOLN
4.0000 mg | Freq: Once | INTRAVENOUS | Status: AC
  Administered 2020-05-19: 4 mg via INTRAVENOUS
  Filled 2020-05-19: qty 1

## 2020-05-19 MED ORDER — LACTATED RINGERS IV BOLUS
1000.0000 mL | Freq: Once | INTRAVENOUS | Status: AC
  Administered 2020-05-19: 1000 mL via INTRAVENOUS

## 2020-05-19 MED ORDER — TAMSULOSIN HCL 0.4 MG PO CAPS: 0.4000 mg | ORAL_CAPSULE | Freq: Every day | ORAL | 0 refills | Status: DC

## 2020-05-19 MED ORDER — CEPHALEXIN 500 MG PO CAPS: 500.0000 mg | ORAL_CAPSULE | Freq: Once | ORAL | Status: AC

## 2020-05-19 MED ORDER — DIPHENHYDRAMINE HCL 25 MG PO CAPS: 25.0000 mg | ORAL_CAPSULE | ORAL | Status: AC

## 2020-05-19 MED ORDER — DIAZEPAM 5 MG PO TABS: 10.0000 mg | ORAL_TABLET | ORAL | Status: AC

## 2020-05-19 MED ORDER — DIPHENHYDRAMINE HCL 25 MG PO CAPS
ORAL_CAPSULE | ORAL | Status: AC
  Administered 2020-05-19: 25 mg via ORAL
  Filled 2020-05-19: qty 1

## 2020-05-19 MED ORDER — ONDANSETRON HCL 4 MG/2ML IJ SOLN
4.0000 mg | Freq: Once | INTRAMUSCULAR | Status: AC
  Administered 2020-05-19: 4 mg via INTRAVENOUS
  Filled 2020-05-19: qty 2

## 2020-05-19 MED ORDER — DIAZEPAM 5 MG PO TABS
ORAL_TABLET | ORAL | Status: AC
  Administered 2020-05-19: 10 mg via ORAL
  Filled 2020-05-19: qty 2

## 2020-05-19 NOTE — ED Notes (Signed)
Pt requesting additional pain meds, verbal order for 4 mg morphine Dr Roxan Hockey

## 2020-05-19 NOTE — ED Notes (Signed)
Urology at bedside.

## 2020-05-19 NOTE — ED Notes (Signed)
Pt to OR.

## 2020-05-19 NOTE — ED Notes (Signed)
Pt to XRAY

## 2020-05-19 NOTE — Brief Op Note (Signed)
05/19/2020  12:55 PM  PATIENT:  Ariel Payne  56 y.o. female  PRE-OPERATIVE DIAGNOSIS: 6 mm Left mid ureteral stone  POST-OPERATIVE DIAGNOSIS:  Same  PROCEDURE:  Procedure(s): EXTRACORPOREAL SHOCK WAVE LITHOTRIPSY (ESWL) (Left)  SURGEON:  Surgeon(s) and Role:    * Sondra Come, MD - Primary  ANESTHESIA: Conscious Sedation  EBL:  None  Drains: None  Specimen: None  Findings:  1. Uncomplicated SWL, tolerated well, stone smudged  DISPO: Flomax, pain meds PRN, RTC 2 weeks KUB  Legrand Rams, MD 05/19/2020

## 2020-05-19 NOTE — ED Triage Notes (Signed)
Pt comes into the ED via POV c/o left flank pain that started on Tuesday.  Pt denies any urinary problems but does have nausea.  Pt denies any h/o kidney stones.  Pt in NAD at this time with even and unlabored respirations.  Pt ambulatory to exam room.  Pt denies any known injury to the lower back.
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
## 2020-05-19 NOTE — ED Provider Notes (Signed)
Patient received in  signout from Dr. Katrinka Blazing pending CT as well as blood work and reassessment.  CT imaging shows evidence of left ureteral stone.  Mild white count but no fever urine appears contaminated without nitrite or leuk esterase.  Pain improved with IV pain medication.  I discussed case in consultation with Dr. Richardo Hanks of urology who kindly evaluated patient at bedside and recommended lithotripsy.  Patient agreeable to plan.   Willy Eddy, MD 05/19/20 [phone]
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
## 2020-05-19 NOTE — Discharge Instructions (Signed)

## 2020-05-19 NOTE — Telephone Encounter (Signed)
LMOM for pt to call office to schedule follow up with PA in 2 weeks with KUB

## 2020-05-23 ENCOUNTER — Encounter: Payer: Self-pay | Admitting: Urology

## 2020-05-23 ENCOUNTER — Other Ambulatory Visit: Payer: Self-pay | Admitting: Internal Medicine

## 2020-05-23 DIAGNOSIS — Z1231 Encounter for screening mammogram for malignant neoplasm of breast: Secondary | ICD-10-CM

## 2020-05-30 ENCOUNTER — Other Ambulatory Visit: Payer: Self-pay | Admitting: Urology

## 2020-06-03 NOTE — Telephone Encounter (Signed)
LMOM for pt to call office to schedule appt and KUB.

## 2020-06-03 NOTE — Telephone Encounter (Signed)
-----   Message from Sondra Come, MD sent at 05/19/2020 12:55 PM EDT ----- Regarding: follow up Please schedule follow up with PA in 2 weeks for KUB, thanks  Legrand Rams, MD 05/19/2020

## 2020-06-14 ENCOUNTER — Encounter: Payer: Self-pay | Admitting: Internal Medicine

## 2020-06-14 ENCOUNTER — Ambulatory Visit
Admission: RE | Admit: 2020-06-14 | Discharge: 2020-06-14 | Disposition: A | Discharge status: Home / Self Care | Payer: BC Managed Care – PPO | Source: Ambulatory Visit | Attending: Internal Medicine | Admitting: Internal Medicine

## 2020-06-14 ENCOUNTER — Other Ambulatory Visit: Payer: Self-pay

## 2020-06-14 ENCOUNTER — Ambulatory Visit: Payer: BC Managed Care – PPO | Admitting: Internal Medicine

## 2020-06-14 VITALS — BP 108/70 | HR 70 | Temp 97.9°F | Ht 65.0 in | Wt 136.8 lb

## 2020-06-14 DIAGNOSIS — Z23 Encounter for immunization: Secondary | ICD-10-CM

## 2020-06-14 DIAGNOSIS — D72829 Elevated white blood cell count, unspecified: Secondary | ICD-10-CM

## 2020-06-14 DIAGNOSIS — Z13220 Encounter for screening for lipoid disorders: Secondary | ICD-10-CM | POA: Diagnosis not present

## 2020-06-14 DIAGNOSIS — Z1231 Encounter for screening mammogram for malignant neoplasm of breast: Secondary | ICD-10-CM | POA: Diagnosis not present

## 2020-06-14 DIAGNOSIS — Z1283 Encounter for screening for malignant neoplasm of skin: Secondary | ICD-10-CM

## 2020-06-14 DIAGNOSIS — N200 Calculus of kidney: Secondary | ICD-10-CM

## 2020-06-14 DIAGNOSIS — D72820 Lymphocytosis (symptomatic): Secondary | ICD-10-CM

## 2020-06-14 DIAGNOSIS — Z1329 Encounter for screening for other suspected endocrine disorder: Secondary | ICD-10-CM

## 2020-06-14 DIAGNOSIS — B009 Herpesviral infection, unspecified: Secondary | ICD-10-CM

## 2020-06-14 DIAGNOSIS — E559 Vitamin D deficiency, unspecified: Secondary | ICD-10-CM

## 2020-06-14 DIAGNOSIS — N3001 Acute cystitis with hematuria: Secondary | ICD-10-CM

## 2020-06-14 DIAGNOSIS — Z13818 Encounter for screening for other digestive system disorders: Secondary | ICD-10-CM

## 2020-06-14 DIAGNOSIS — Z9081 Acquired absence of spleen: Secondary | ICD-10-CM

## 2020-06-14 DIAGNOSIS — Z0000 Encounter for general adult medical examination without abnormal findings: Secondary | ICD-10-CM | POA: Diagnosis not present

## 2020-06-14 DIAGNOSIS — R739 Hyperglycemia, unspecified: Secondary | ICD-10-CM

## 2020-06-14 DIAGNOSIS — N921 Excessive and frequent menstruation with irregular cycle: Secondary | ICD-10-CM

## 2020-06-14 MED ORDER — DENAVIR 1 % EX CREA
1.0000 | TOPICAL_CREAM | CUTANEOUS | 11 refills | Status: AC
Start: 2020-06-14 — End: ?

## 2020-06-14 MED ORDER — SHINGRIX 50 MCG/0.5ML IM SUSR: 0.5000 mL | Freq: Once | INTRAMUSCULAR | 0 refills | Status: AC

## 2020-06-14 MED ORDER — PNEUMOCOCCAL VAC POLYVALENT 25 MCG/0.5ML IJ INJ: 0.5000 mL | INJECTION | INTRAMUSCULAR | 0 refills | Status: AC

## 2020-06-14 NOTE — Patient Instructions (Addendum)
Consider boosters of pfizer at pharmacy  shingrix x 2 doses -2nd dose w/in 6 months of less of the 1st dose -at pharmacy  Tdap vaccine  Pneumonia 23 vaccine -at pharmacy   Vitamin D3    Pneumococcal Polysaccharide Vaccine (PPSV23): What You Need to Know 1. Why get vaccinated? Pneumococcal polysaccharide vaccine (PPSV23) can prevent pneumococcal disease. Pneumococcal disease refers to any illness caused by pneumococcal bacteria. These bacteria can cause many types of illnesses, including pneumonia, which is an infection of the lungs. Pneumococcal bacteria are one of the most common causes of pneumonia. Besides pneumonia, pneumococcal bacteria can also cause:  Ear infections  Sinus infections  Meningitis (infection of the tissue covering the brain and spinal cord)  Bacteremia (bloodstream infection) Anyone can get pneumococcal disease, but children under 19 years of age, people with certain medical conditions, adults 65 years or older, and cigarette smokers are at the highest risk. Most pneumococcal infections are mild. However, some can result in long-term problems, such as brain damage or hearing loss. Meningitis, bacteremia, and pneumonia caused by pneumococcal disease can be fatal. 2. PPSV23 PPSV23 protects against 23 types of bacteria that cause pneumococcal disease. PPSV23 is recommended for:  All adults 65 years or older,  Anyone 2 years or older with certain medical conditions that can lead to an increased risk for pneumococcal disease. Most people need only one dose of PPSV23. A second dose of PPSV23, and another type of pneumococcal vaccine called PCV13, are recommended for certain high-risk groups. Your health care provider can give you more information. People 65 years or older should get a dose of PPSV23 even if they have already gotten one or more doses of the vaccine before they turned 31. 3. Talk with your health care provider Tell your vaccine provider if the person  getting the vaccine:  Has had an allergic reaction after a previous dose of PPSV23, or has any severe, life-threatening allergies. In some cases, your health care provider may decide to postpone PPSV23 vaccination to a future visit. People with minor illnesses, such as a cold, may be vaccinated. People who are moderately or severely ill should usually wait until they recover before getting PPSV23. Your health care provider can give you more information. 4. Risks of a vaccine reaction  Redness or pain where the shot is given, feeling tired, fever, or muscle aches can happen after PPSV23. People sometimes faint after medical procedures, including vaccination. Tell your provider if you feel dizzy or have vision changes or ringing in the ears. As with any medicine, there is a very remote chance of a vaccine causing a severe allergic reaction, other serious injury, or death. 5. What if there is a serious problem? An allergic reaction could occur after the vaccinated person leaves the clinic. If you see signs of a severe allergic reaction (hives, swelling of the face and throat, difficulty breathing, a fast heartbeat, dizziness, or weakness), call 9-1-1 and get the person to the nearest hospital. For other signs that concern you, call your health care provider. Adverse reactions should be reported to the Vaccine Adverse Event Reporting System (VAERS). Your health care provider will usually file this report, or you can do it yourself. Visit the VAERS website at www.vaers.LAgents.no or call 838-156-2916. VAERS is only for reporting reactions, and VAERS staff do not give medical advice. 6. How can I learn more?  Ask your health care provider.  Call your local or state health department.  Contact the Centers for Disease Control  and Prevention (CDC): ? Call (581)828-7745 (1-800-CDC-INFO) or ? Visit CDC's website at PicCapture.uy Vaccine Information Statement PPSV23 Vaccine (12/04/2017) This  information is not intended to replace advice given to you by your health care provider. Make sure you discuss any questions you have with your health care provider. Document Revised: 09/25/2019 Document Reviewed: 09/25/2019 Elsevier Patient Education  2021 Elsevier Inc.  Tdap (Tetanus, Diphtheria, Pertussis) Vaccine: What You Need to Know 1. Why get vaccinated? Tdap vaccine can prevent tetanus, diphtheria, and pertussis. Diphtheria and pertussis spread from person to person. Tetanus enters the body through cuts or wounds.  TETANUS (T) causes painful stiffening of the muscles. Tetanus can lead to serious health problems, including being unable to open the mouth, having trouble swallowing and breathing, or death.  DIPHTHERIA (D) can lead to difficulty breathing, heart failure, paralysis, or death.  PERTUSSIS (aP), also known as "whooping cough," can cause uncontrollable, violent coughing that makes it hard to breathe, eat, or drink. Pertussis can be extremely serious especially in babies and young children, causing pneumonia, convulsions, brain damage, or death. In teens and adults, it can cause weight loss, loss of bladder control, passing out, and rib fractures from severe coughing. 2. Tdap vaccine Tdap is only for children 7 years and older, adolescents, and adults.  Adolescents should receive a single dose of Tdap, preferably at age 34 or 12 years. Pregnant people should get a dose of Tdap during every pregnancy, preferably during the early part of the third trimester, to help protect the newborn from pertussis. Infants are most at risk for severe, life-threatening complications from pertussis. Adults who have never received Tdap should get a dose of Tdap. Also, adults should receive a booster dose of either Tdap or Td (a different vaccine that protects against tetanus and diphtheria but not pertussis) every 10 years, or after 5 years in the case of a severe or dirty wound or burn. Tdap may  be given at the same time as other vaccines. 3. Talk with your health care provider Tell your vaccine provider if the person getting the vaccine:  Has had an allergic reaction after a previous dose of any vaccine that protects against tetanus, diphtheria, or pertussis, or has any severe, life-threatening allergies  Has had a coma, decreased level of consciousness, or prolonged seizures within 7 days after a previous dose of any pertussis vaccine (DTP, DTaP, or Tdap)  Has seizures or another nervous system problem  Has ever had Guillain-Barr Syndrome (also called "GBS")  Has had severe pain or swelling after a previous dose of any vaccine that protects against tetanus or diphtheria In some cases, your health care provider may decide to postpone Tdap vaccination until a future visit. People with minor illnesses, such as a cold, may be vaccinated. People who are moderately or severely ill should usually wait until they recover before getting Tdap vaccine.  Your health care provider can give you more information. 4. Risks of a vaccine reaction  Pain, redness, or swelling where the shot was given, mild fever, headache, feeling tired, and nausea, vomiting, diarrhea, or stomachache sometimes happen after Tdap vaccination. People sometimes faint after medical procedures, including vaccination. Tell your provider if you feel dizzy or have vision changes or ringing in the ears.  As with any medicine, there is a very remote chance of a vaccine causing a severe allergic reaction, other serious injury, or death. 5. What if there is a serious problem? An allergic reaction could occur after the vaccinated person  leaves the clinic. If you see signs of a severe allergic reaction (hives, swelling of the face and throat, difficulty breathing, a fast heartbeat, dizziness, or weakness), call 9-1-1 and get the person to the nearest hospital. For other signs that concern you, call your health care provider.   Adverse reactions should be reported to the Vaccine Adverse Event Reporting System (VAERS). Your health care provider will usually file this report, or you can do it yourself. Visit the VAERS website at www.vaers.LAgents.no or call 920-244-4716. VAERS is only for reporting reactions, and VAERS staff members do not give medical advice. 6. The National Vaccine Injury Compensation Program The [employer] Vaccine Injury Compensation Program (VICP) is a federal program that was created to compensate people who may have been injured by certain vaccines. Claims regarding alleged injury or death due to vaccination have a time limit for filing, which may be as short as two years. Visit the VICP website at SpiritualWord.at or call 731-069-3607 to learn about the program and about filing a claim. 7. How can I learn more?  Ask your health care provider.  Call your local or state health department.  Visit the website of the Food and Drug Administration (FDA) for vaccine package inserts and additional information at FinderList.no.  Contact the Centers for Disease Control and Prevention (CDC): ? Call (424)696-2058 (1-800-CDC-INFO) or ? Visit CDC's website at PicCapture.uy. Vaccine Information Statement Tdap (Tetanus, Diphtheria, Pertussis) Vaccine (09/11/2019) This information is not intended to replace advice given to you by your health care provider. Make sure you discuss any questions you have with your health care provider. Document Revised: 10/07/2019 Document Reviewed: 10/07/2019 Elsevier Patient Education  2021 Elsevier Inc.   Zoster Vaccine, Recombinant injection What is this medicine? ZOSTER VACCINE (ZOS ter vak SEEN) is a vaccine used to reduce the risk of getting shingles. This vaccine is not used to treat shingles or nerve pain from shingles. This medicine may be used for other purposes; ask your health care provider or pharmacist if you  have questions. COMMON BRAND NAME(S): [facility] What should I tell my health care provider before I take this medicine? They need to know if you have any of these conditions:  cancer  immune system problems  an unusual or allergic reaction to Zoster vaccine, other medications, foods, dyes, or preservatives  pregnant or trying to get pregnant  breast-feeding How should I use this medicine? This vaccine is injected into a muscle. It is given by a health care provider. A copy of Vaccine Information Statements will be given before each vaccination. Be sure to read this information carefully each time. This sheet may change often. Talk to your health care provider about the use of this vaccine in children. This vaccine is not approved for use in children. Overdosage: If you think you have taken too much of this medicine contact a poison control center or emergency room at once. NOTE: This medicine is only for you. Do not share this medicine with others. What if I miss a dose? Keep appointments for follow-up (booster) doses. It is important not to miss your dose. Call your health care provider if you are unable to keep an appointment. What may interact with this medicine?  medicines that suppress your immune system  medicines to treat cancer  steroid medicines like prednisone or cortisone This list may not describe all possible interactions. Give your health care provider a list of all the medicines, herbs, non-prescription drugs, or dietary supplements you use. Also tell them  if you smoke, drink alcohol, or use illegal drugs. Some items may interact with your medicine. What should I watch for while using this medicine? Visit your health care provider regularly. This vaccine, like all vaccines, may not fully protect everyone. What side effects may I notice from receiving this medicine? Side effects that you should report to your doctor or health care professional as soon as  possible:  allergic reactions (skin rash, itching or hives; swelling of the face, lips, or tongue)  trouble breathing Side effects that usually do not require medical attention (report these to your doctor or health care professional if they continue or are bothersome):  chills  headache  fever  nausea  pain, redness, or irritation at site where injected  tiredness  vomiting This list may not describe all possible side effects. Call your doctor for medical advice about side effects. You may report side effects to FDA at 1-800-FDA-1088. Where should I keep my medicine? This vaccine is only given by a health care provider. It will not be stored at home. NOTE: This sheet is a summary. It may not cover all possible information. If you have questions about this medicine, talk to your doctor, pharmacist, or health care provider.  2021 Elsevier/Gold Standard (2019-02-27 16:23:07)   Tdap (Tetanus, Diphtheria, Pertussis) Vaccine: What You Need to Know 1. Why get vaccinated? Tdap vaccine can prevent tetanus, diphtheria, and pertussis. Diphtheria and pertussis spread from person to person. Tetanus enters the body through cuts or wounds.  TETANUS (T) causes painful stiffening of the muscles. Tetanus can lead to serious health problems, including being unable to open the mouth, having trouble swallowing and breathing, or death.  DIPHTHERIA (D) can lead to difficulty breathing, heart failure, paralysis, or death.  PERTUSSIS (aP), also known as "whooping cough," can cause uncontrollable, violent coughing that makes it hard to breathe, eat, or drink. Pertussis can be extremely serious especially in babies and young children, causing pneumonia, convulsions, brain damage, or death. In teens and adults, it can cause weight loss, loss of bladder control, passing out, and rib fractures from severe coughing. 2. Tdap vaccine Tdap is only for children 7 years and older, adolescents, and adults.   Adolescents should receive a single dose of Tdap, preferably at age 12 or 12 years. Pregnant people should get a dose of Tdap during every pregnancy, preferably during the early part of the third trimester, to help protect the newborn from pertussis. Infants are most at risk for severe, life-threatening complications from pertussis. Adults who have never received Tdap should get a dose of Tdap. Also, adults should receive a booster dose of either Tdap or Td (a different vaccine that protects against tetanus and diphtheria but not pertussis) every 10 years, or after 5 years in the case of a severe or dirty wound or burn. Tdap may be given at the same time as other vaccines. 3. Talk with your health care provider Tell your vaccine provider if the person getting the vaccine:  Has had an allergic reaction after a previous dose of any vaccine that protects against tetanus, diphtheria, or pertussis, or has any severe, life-threatening allergies  Has had a coma, decreased level of consciousness, or prolonged seizures within 7 days after a previous dose of any pertussis vaccine (DTP, DTaP, or Tdap)  Has seizures or another nervous system problem  Has ever had Guillain-Barr Syndrome (also called "GBS")  Has had severe pain or swelling after a previous dose of any vaccine that protects against  tetanus or diphtheria In some cases, your health care provider may decide to postpone Tdap vaccination until a future visit. People with minor illnesses, such as a cold, may be vaccinated. People who are moderately or severely ill should usually wait until they recover before getting Tdap vaccine.  Your health care provider can give you more information. 4. Risks of a vaccine reaction  Pain, redness, or swelling where the shot was given, mild fever, headache, feeling tired, and nausea, vomiting, diarrhea, or stomachache sometimes happen after Tdap vaccination. People sometimes faint after medical procedures,  including vaccination. Tell your provider if you feel dizzy or have vision changes or ringing in the ears.  As with any medicine, there is a very remote chance of a vaccine causing a severe allergic reaction, other serious injury, or death. 5. What if there is a serious problem? An allergic reaction could occur after the vaccinated person leaves the clinic. If you see signs of a severe allergic reaction (hives, swelling of the face and throat, difficulty breathing, a fast heartbeat, dizziness, or weakness), call 9-1-1 and get the person to the nearest hospital. For other signs that concern you, call your health care provider.  Adverse reactions should be reported to the Vaccine Adverse Event Reporting System (VAERS). Your health care provider will usually file this report, or you can do it yourself. Visit the VAERS website at www.vaers.[place]gents.nohhs.gov or call 571-427-72701-(858)713-7899. VAERS is only for reporting reactions, and VAERS staff members do not give medical advice. 6. The National Vaccine Injury Compensation Program The [employer] Vaccine Injury Compensation Program (VICP) is a federal program that was created to compensate people who may have been injured by certain vaccines. Claims regarding alleged injury or death due to vaccination have a time limit for filing, which may be as short as two years. Visit the VICP website at SpiritualWord.atwww.hrsa.gov/vaccinecompensation or call 519-176-91111-(660)844-0855 to learn about the program and about filing a claim. 7. How can I learn more?  Ask your health care provider.  Call your local or state health department.  Visit the website of the Food and Drug Administration (FDA) for vaccine package inserts and additional information at FinderList.nowww.fda.gov/vaccines-blood-biologics/vaccines.  Contact the Centers for Disease Control and Prevention (CDC): ? Call 702-151-49101-671-370-7110 (1-800-CDC-INFO) or ? Visit CDC's website at PicCapture.uywww.cdc.gov/vaccines. Vaccine Information Statement Tdap (Tetanus, Diphtheria,  Pertussis) Vaccine (09/11/2019) This information is not intended to replace advice given to you by your health care provider. Make sure you discuss any questions you have with your health care provider. Document Revised: 10/07/2019 Document Reviewed: 10/07/2019 Elsevier Patient Education  2021 Elsevier Inc.   Dietary Guidelines to Help Prevent Kidney Stones Kidney stones are deposits of minerals and salts that form inside your kidneys. Your risk of developing kidney stones may be greater depending on your diet, your lifestyle, the medicines you take, and whether you have certain medical conditions. Most people can lower their chances of developing kidney stones by following the instructions below. Your dietitian may give you more specific instructions depending on your overall health and the type of kidney stones you tend to develop. What are tips for following this plan? Reading food labels  Choose foods with "no salt added" or "low-salt" labels. Limit your salt (sodium) intake to less than 1,500 mg a day.  Choose foods with calcium for each meal and snack. Try to eat about 300 mg of calcium at each meal. Foods that contain 200-500 mg of calcium a serving include: ? 8 oz (237 mL) of milk,  calcium-fortifiednon-dairy milk, and calcium-fortifiedfruit juice. Calcium-fortified means that calcium has been added to these drinks. ? 8 oz (237 mL) of kefir, yogurt, and soy yogurt. ? 4 oz (114 g) of tofu. ? 1 oz (28 g) of cheese. ? 1 cup (150 g) of dried figs. ? 1 cup (91 g) of cooked broccoli. ? One 3 oz (85 g) can of sardines or mackerel. Most people need 1,000-1,500 mg of calcium a day. Talk to your dietitian about how much calcium is recommended for you.   Shopping  Buy plenty of fresh fruits and vegetables. Most people do not need to avoid fruits and vegetables, even if these foods contain nutrients that may contribute to kidney stones.  When shopping for convenience foods, choose: ? Whole  pieces of fruit. ? Pre-made salads with dressing on the side. ? Low-fat fruit and yogurt smoothies.  Avoid buying frozen meals or prepared deli foods. These can be high in sodium.  Look for foods with live cultures, such as yogurt and kefir.  Choose high-fiber grains, such as whole-wheat breads, oat bran, and wheat cereals. Cooking  Do not add salt to food when cooking. Place a salt shaker on the table and allow each person to add his or her own salt to taste.  Use vegetable protein, such as beans, textured vegetable protein (TVP), or tofu, instead of meat in pasta, casseroles, and soups. Meal planning  Eat less salt, if told by your dietitian. To do this: ? Avoid eating processed or pre-made food. ? Avoid eating fast food.  Eat less animal protein, including cheese, meat, poultry, or fish, if told by your dietitian. To do this: ? Limit the number of times you have meat, poultry, fish, or cheese each week. Eat a diet free of meat at least 2 days a week. ? Eat only one serving each day of meat, poultry, fish, or seafood. ? When you prepare animal protein, cut pieces into small portion sizes. For most meat and fish, one serving is about the size of the palm of your hand.  Eat at least five servings of fresh fruits and vegetables each day. To do this: ? Keep fruits and vegetables on hand for snacks. ? Eat one piece of fruit or a handful of berries with breakfast. ? Have a salad and fruit at lunch. ? Have two kinds of vegetables at dinner.  Limit foods that are high in a substance called oxalate. These include: ? Spinach (cooked), rhubarb, beets, sweet potatoes, and Swiss chard. ? Peanuts. ? Potato chips, french fries, and baked potatoes with skin on. ? Nuts and nut products. ? Chocolate.  If you regularly take a diuretic medicine, make sure to eat at least 1 or 2 servings of fruits or vegetables that are high in potassium each day. These include: ? Avocado. ? Banana. ? Orange,  prune, carrot, or tomato juice. ? Baked potato. ? Cabbage. ? Beans and split peas. Lifestyle  Drink enough fluid to keep your urine pale yellow. This is the most important thing you can do. Spread your fluid intake throughout the day.  If you drink alcohol: ? Limit how much you use to:  0-1 drink a day for women who are not pregnant.  0-2 drinks a day for men. ? Be aware of how much alcohol is in your drink. In the [place] one drink equals one 12 oz bottle of beer (355 mL), one 5 oz glass of wine (148 mL), or one 1 oz glass of hard  liquor (44 mL).  Lose weight if told by your health care provider. Work with your dietitian to find an eating plan and weight loss strategies that work best for you.   General information  Talk to your health care provider and dietitian about taking daily supplements. You may be told the following depending on your health and the cause of your kidney stones: ? Not to take supplements with vitamin C. ? To take a calcium supplement. ? To take a daily probiotic supplement. ? To take other supplements such as magnesium, fish oil, or vitamin B6.  Take over-the-counter and prescription medicines only as told by your health care provider. These include supplements. What foods should I limit? Limit your intake of the following foods, or eat them as told by your dietitian. Vegetables Spinach. Rhubarb. Beets. Canned vegetables. Rosita Fire. Olives. Baked potatoes with skin. Grains Wheat bran. Baked goods. Salted crackers. Cereals high in sugar. Meats and other proteins Nuts. Nut butters. Large portions of meat, poultry, or fish. Salted, precooked, or cured meats, such as sausages, meat loaves, and hot dogs. Dairy Cheese. Beverages Regular soft drinks. Regular vegetable juice. Seasonings and condiments Seasoning blends with salt. Salad dressings. Soy sauce. Ketchup. Barbecue sauce. Other foods Canned soups. Canned pasta sauce. Casseroles. Pizza. Lasagna. Frozen  meals. Potato chips. [place] fries. The items listed above may not be a complete list of foods and beverages you should limit. Contact a dietitian for more information. What foods should I avoid? Talk to your dietitian about specific foods you should avoid based on the type of kidney stones you have and your overall health. Fruits Grapefruit. The item listed above may not be a complete list of foods and beverages you should avoid. Contact a dietitian for more information. Summary  Kidney stones are deposits of minerals and salts that form inside your kidneys.  You can lower your risk of kidney stones by making changes to your diet.  The most important thing you can do is drink enough fluid. Drink enough fluid to keep your urine pale yellow.  Talk to your dietitian about how much calcium you should have each day, and eat less salt and animal protein as told by your dietitian. This information is not intended to replace advice given to you by your health care provider. Make sure you discuss any questions you have with your health care provider. Document Revised: 01/15/2019 Document Reviewed: 01/15/2019 Elsevier Patient Education  2021 [employer]  Kidney Stones  Kidney stones are solid, rock-like deposits that form inside of the kidneys. The kidneys are a pair of organs that make urine. A kidney stone may form in a kidney and move into other parts of the urinary tract, including the tubes that connect the kidneys to the bladder (ureters), the bladder, and the tube that carries urine out of the body (urethra). As the stone moves through these areas, it can cause intense pain and block the flow of urine. Kidney stones are created when high levels of certain minerals are found in the urine. The stones are usually passed out of the body through urination, but in some cases, medical treatment may be needed to remove them. What are the causes? Kidney stones may be caused by:  A condition in  which certain glands produce too much parathyroid hormone (primary hyperparathyroidism), which causes too much calcium buildup in the blood.  A buildup of uric acid crystals in the bladder (hyperuricosuria). Uric acid is a chemical that the body produces when you  eat certain foods. It usually exits the body in the urine.  Narrowing (stricture) of one or both of the ureters.  A kidney blockage that is present at birth (congenital obstruction).  Past surgery on the kidney or the ureters, such as gastric bypass surgery. What increases the risk? The following factors may make you more likely to develop this condition:  Having had a kidney stone in the past.  Having a family history of kidney stones.  Not drinking enough water.  Eating a diet that is high in protein, salt (sodium), or sugar.  Being overweight or obese. What are the signs or symptoms? Symptoms of a kidney stone may include:  Pain in the side of the abdomen, right below the ribs (flank pain). Pain usually spreads (radiates) to the groin.  Needing to urinate frequently or urgently.  Painful urination.  Blood in the urine (hematuria).  Nausea.  Vomiting.  Fever and chills. How is this diagnosed? This condition may be diagnosed based on:  Your symptoms and medical history.  A physical exam.  Blood tests.  Urine tests. These may be done before and after the stone passes out of your body through urination.  Imaging tests, such as a CT scan, abdominal X-ray, or ultrasound.  A procedure to examine the inside of the bladder (cystoscopy). How is this treated? Treatment for kidney stones depends on the size, location, and makeup of the stones. Kidney stones will often pass out of the body through urination. You may need to:  Increase your fluid intake to help pass the stone. In some cases, you may be given fluids through an IV and may need to be monitored at the hospital.  Take medicine for pain.  Make  changes in your diet to help prevent kidney stones from coming back. Sometimes, medical procedures are needed to remove a kidney stone. This may involve:  A procedure to break up kidney stones using: ? A focused beam of light (laser therapy). ? Shock waves (extracorporeal shock wave lithotripsy).  Surgery to remove kidney stones. This may be needed if you have severe pain or have stones that block your urinary tract. Follow these instructions at home: Medicines  Take over-the-counter and prescription medicines only as told by your health care provider.  Ask your health care provider if the medicine prescribed to you requires you to avoid driving or using heavy machinery. Eating and drinking  Drink enough fluid to keep your urine pale yellow. You may be instructed to drink at least 8-10 glasses of water each day. This will help you pass the kidney stone.  If directed, change your diet. This may include: ? Limiting how much sodium you eat. ? Eating more fruits and vegetables. ? Limiting how much animal protein--such as red meat, poultry, fish, and eggs--you eat.  Follow instructions from your health care provider about eating or drinking restrictions. General instructions  Collect urine samples as told by your health care provider. You may need to collect a urine sample: ? 24 hours after you pass the stone. ? 8-12 weeks after passing the kidney stone, and every 6-12 months after that.  Strain your urine every time you urinate, for as long as directed. Use the strainer that your health care provider recommends.  Do not throw out the kidney stone after passing it. Keep the stone so it can be tested by your health care provider. Testing the makeup of your kidney stone may help prevent you from getting kidney stones in the  future.  Keep all follow-up visits as told by your health care provider. This is important. You may need follow-up X-rays or ultrasounds to make sure that your stone  has passed. How is this prevented? To prevent another kidney stone:  Drink enough fluid to keep your urine pale yellow. This is the best way to prevent kidney stones.  Eat a healthy diet and follow recommendations from your health care provider about foods to avoid. You may be instructed to eat a low-protein diet. Recommendations vary depending on the type of kidney stone that you have.  Maintain a healthy weight.   Where to find more information  National Kidney [employer] (NKF): www.kidney.org  [facility]): www.urologyhealth.org Contact a health care provider if:  You have pain that gets worse or does not get better with medicine. Get help right away if:  You have a fever or chills.  You develop severe pain.  You develop new abdominal pain.  You faint.  You are unable to urinate. Summary  Kidney stones are solid, rock-like deposits that form inside of the kidneys.  Kidney stones can cause nausea, vomiting, blood in the urine, abdominal pain, and the urge to urinate frequently.  Treatment for kidney stones depends on the size, location, and makeup of the stones. Kidney stones will often pass out of the body through urination.  Kidney stones can be prevented by drinking enough fluids, eating a healthy diet, and maintaining a healthy weight. This information is not intended to replace advice given to you by your health care provider. Make sure you discuss any questions you have with your health care provider. Document Revised: 06/10/2018 Document Reviewed: 06/10/2018 Elsevier Patient Education  2021 [employer]  Long-Term Care After a Splenectomy A splenectomy is surgery to remove a diseased or injured spleen. The spleen is an organ that is located in the upper left part of the abdomen, just under the ribs. The spleen filters and cleans the blood. It also stores blood cells and destroys cells that are old. The spleen, along with other body systems and  organs, plays an important role in the body's natural disease-fighting system (immune system). Not having a spleen may affect your body's ability to fight infections. After the spleen is removed, you have a slightly greater chance of developing a serious, life-threatening infection. The following are some actions that you can take to prevent infection. How can I prevent infection? Your health care provider will recommend actions to help prevent infection. These may include:  Making sure that your immunizations are up to date, including: ? Pneumococcus. ? Seasonal flu (influenza). ? Hib (Haemophilus influenzae type b). ? Meningitis.  Making sure that vaccines are up to date for your family members.  Following good daily practices to prevent infection, such as: ? Washing your hands often, especially after preparing food, eating, changing diapers, and playing with children or animals. ? Disinfecting surfaces regularly. ? Avoiding people who have active illness or infections.  Taking precautions to avoid insect bites, such as: ? Wearing proper clothing that covers the entire body when you are in wooded or marshy areas. ? Changing clothing right away and checking for bites after you have been outside. ? Using insect spray. ? Using insect netting. ? Staying indoors during hours when mosquitoes are most active.  Taking precautions to avoid dog bites. ? After a splenectomy, you may be at increased risk for rare infections that are associated with dog bites.   What do I need  to do if I must travel? If you travel in the [place], take actions to avoid insect bites, especially in [place] and [place] coastal areas. Insects can carry many viruses, and you may be at an increased risk of becoming sick from these viruses. You should also take precautions if you travel abroad to places where malaria is common. In that case, follow these guidelines:  Contact your health care provider to get specific  advice about the places that you will be visiting.  Get specific immunizations to guard against the disease risks in the country that you will be visiting.  Understand how to prevent infections, such as malaria, while you are abroad. These infections can pose serious risk. Precautions may include: ? Daily tablets to prevent malaria. ? Taking other precautions to prevent insect bites.  Bring broad-spectrum antibiotic medicines with you if they have been prescribed. What other things do I need to remember to do?  Take over-the-counter and prescription medicines only as told by your health care provider.  If you were prescribed an antibiotic medicine: ? Take it as told by your health care provider. ? Do not stop taking the antibiotic even if you start to feel better. ? Talk with your health care provider about using a probiotic supplement to prevent stomach upset.  Keep track of medicine refills so you do not run out of medicine.  Always tell your health care providers that you do not have a spleen before you have any procedures. These include medical and dental procedures.  Inform your close contacts of your condition. Consider wearing a medical alert bracelet or carrying an ID card.  Keep all follow-up visits as told by your health care provider. This is important.   Contact a health care provider if:  You have a fever.  You have signs of infection that continue after taking an antibiotic. Signs may include a fever, chills, and feeling unwell.  You are considering travel abroad.  You are bitten by a tick or a dog. Get help right away if:  You have chest pain along with: ? Shortness of breath. ? Pain in the back, neck, or jaw.  You have pain or swelling in the leg.  You develop a sudden headache and dizziness. Summary  The spleen plays an important role in fighting disease and infections. If you have had your spleen surgically removed, you should take steps to help prevent  infections.  Make sure that your vaccinations are up to date.  Follow good daily practices to prevent infection, such as washing your hands often and avoiding people who are sick.  Make sure that your family members, others who are close to you, and all of your health care providers know that your spleen has been removed.  Contact a health care provider if you have a fever or any signs of infection. This information is not intended to replace advice given to you by your health care provider. Make sure you discuss any questions you have with your health care provider. Document Revised: 11/21/2017 Document Reviewed: 11/21/2017 Elsevier Patient Education  2021 [employer].  Long-Term Care After a Splenectomy A splenectomy is surgery to remove a diseased or injured spleen. The spleen is an organ that is located in the upper left part of the abdomen, just under the ribs. The spleen filters and cleans the blood. It also stores blood cells and destroys cells that are old. The spleen, along with other body systems and organs, plays an important role  in the body's natural disease-fighting system (immune system). Not having a spleen may affect your body's ability to fight infections. After the spleen is removed, you have a slightly greater chance of developing a serious, life-threatening infection. The following are some actions that you can take to prevent infection. How can I prevent infection? Your health care provider will recommend actions to help prevent infection. These may include:  Making sure that your immunizations are up to date, including: ? Pneumococcus. ? Seasonal flu (influenza). ? Hib (Haemophilus influenzae type b). ? Meningitis.  Making sure that vaccines are up to date for your family members.  Following good daily practices to prevent infection, such as: ? Washing your hands often, especially after preparing food, eating, changing diapers, and playing with children or  animals. ? Disinfecting surfaces regularly. ? Avoiding people who have active illness or infections.  Taking precautions to avoid insect bites, such as: ? Wearing proper clothing that covers the entire body when you are in wooded or marshy areas. ? Changing clothing right away and checking for bites after you have been outside. ? Using insect spray. ? Using insect netting. ? Staying indoors during hours when mosquitoes are most active.  Taking precautions to avoid dog bites. ? After a splenectomy, you may be at increased risk for rare infections that are associated with dog bites.   What do I need to do if I must travel? If you travel in the [place], take actions to avoid insect bites, especially in [place] and [place] coastal areas. Insects can carry many viruses, and you may be at an increased risk of becoming sick from these viruses. You should also take precautions if you travel abroad to places where malaria is common. In that case, follow these guidelines:  Contact your health care provider to get specific advice about the places that you will be visiting.  Get specific immunizations to guard against the disease risks in the country that you will be visiting.  Understand how to prevent infections, such as malaria, while you are abroad. These infections can pose serious risk. Precautions may include: ? Daily tablets to prevent malaria. ? Taking other precautions to prevent insect bites.  Bring broad-spectrum antibiotic medicines with you if they have been prescribed. What other things do I need to remember to do?  Take over-the-counter and prescription medicines only as told by your health care provider.  If you were prescribed an antibiotic medicine: ? Take it as told by your health care provider. ? Do not stop taking the antibiotic even if you start to feel better. ? Talk with your health care provider about using a probiotic supplement to prevent stomach upset.  Keep  track of medicine refills so you do not run out of medicine.  Always tell your health care providers that you do not have a spleen before you have any procedures. These include medical and dental procedures.  Inform your close contacts of your condition. Consider wearing a medical alert bracelet or carrying an ID card.  Keep all follow-up visits as told by your health care provider. This is important.   Contact a health care provider if:  You have a fever.  You have signs of infection that continue after taking an antibiotic. Signs may include a fever, chills, and feeling unwell.  You are considering travel abroad.  You are bitten by a tick or a dog. Get help right away if:  You have chest pain along with: ? Shortness of breath. ?  Pain in the back, neck, or jaw.  You have pain or swelling in the leg.  You develop a sudden headache and dizziness. Summary  The spleen plays an important role in fighting disease and infections. If you have had your spleen surgically removed, you should take steps to help prevent infections.  Make sure that your vaccinations are up to date.  Follow good daily practices to prevent infection, such as washing your hands often and avoiding people who are sick.  Make sure that your family members, others who are close to you, and all of your health care providers know that your spleen has been removed.  Contact a health care provider if you have a fever or any signs of infection. This information is not intended to replace advice given to you by your health care provider. Make sure you discuss any questions you have with your health care provider. Document Revised: 11/21/2017 Document Reviewed: 11/21/2017 Elsevier Patient Education  2021 [employer].
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
## 2020-06-15 ENCOUNTER — Other Ambulatory Visit: Payer: Self-pay | Admitting: *Deleted

## 2020-06-15 DIAGNOSIS — N200 Calculus of kidney: Secondary | ICD-10-CM

## 2020-06-16 ENCOUNTER — Encounter: Payer: Self-pay | Admitting: Internal Medicine

## 2020-06-16 NOTE — Telephone Encounter (Signed)
Okay to fax all labs from 06/14/20 that were ordered?

## 2020-06-21 ENCOUNTER — Encounter: Payer: Self-pay | Admitting: Internal Medicine

## 2020-06-29 ENCOUNTER — Ambulatory Visit: Payer: Self-pay | Admitting: Urology

## 2020-07-01 ENCOUNTER — Encounter: Payer: Self-pay | Admitting: Urology

## 2020-07-08 DIAGNOSIS — R739 Hyperglycemia, unspecified: Secondary | ICD-10-CM | POA: Diagnosis not present

## 2020-07-08 DIAGNOSIS — Z13220 Encounter for screening for lipoid disorders: Secondary | ICD-10-CM | POA: Diagnosis not present

## 2020-07-08 DIAGNOSIS — D72829 Elevated white blood cell count, unspecified: Secondary | ICD-10-CM | POA: Diagnosis not present

## 2020-07-08 DIAGNOSIS — E559 Vitamin D deficiency, unspecified: Secondary | ICD-10-CM | POA: Diagnosis not present

## 2020-07-08 DIAGNOSIS — N3001 Acute cystitis with hematuria: Secondary | ICD-10-CM | POA: Diagnosis not present

## 2020-07-08 DIAGNOSIS — Z1329 Encounter for screening for other suspected endocrine disorder: Secondary | ICD-10-CM | POA: Diagnosis not present

## 2020-07-08 DIAGNOSIS — Z0000 Encounter for general adult medical examination without abnormal findings: Secondary | ICD-10-CM | POA: Diagnosis not present

## 2020-07-09 LAB — URINALYSIS, ROUTINE W REFLEX MICROSCOPIC
Bilirubin, UA: NEGATIVE
Glucose, UA: NEGATIVE
Ketones, UA: NEGATIVE
Nitrite, UA: NEGATIVE
Protein,UA: NEGATIVE
RBC, UA: NEGATIVE
Specific Gravity, UA: 1.013 (ref 1.005–1.030)
Urobilinogen, Ur: 0.2 mg/dL (ref 0.2–1.0)
pH, UA: 7 (ref 5.0–7.5)

## 2020-07-09 LAB — CBC WITH DIFFERENTIAL/PLATELET
Basophils Absolute: 0.1 10*3/uL (ref 0.0–0.2)
Basos: 1 %
EOS (ABSOLUTE): 0.2 10*3/uL (ref 0.0–0.4)
Eos: 3 %
Hematocrit: 35.3 % (ref 34.0–46.6)
Hemoglobin: 12.2 g/dL (ref 11.1–15.9)
Immature Grans (Abs): 0 10*3/uL (ref 0.0–0.1)
Immature Granulocytes: 0 %
Lymphocytes Absolute: 3.8 10*3/uL — ABNORMAL HIGH (ref 0.7–3.1)
Lymphs: 55 %
MCH: 33.3 pg — ABNORMAL HIGH (ref 26.6–33.0)
MCHC: 34.6 g/dL (ref 31.5–35.7)
MCV: 96 fL (ref 79–97)
Monocytes Absolute: 0.5 10*3/uL (ref 0.1–0.9)
Monocytes: 8 %
Neutrophils Absolute: 2.3 10*3/uL (ref 1.4–7.0)
Neutrophils: 33 %
Platelets: 432 10*3/uL (ref 150–450)
RBC: 3.66 x10E6/uL — ABNORMAL LOW (ref 3.77–5.28)
RDW: 12.9 % (ref 11.7–15.4)
WBC: 7 10*3/uL (ref 3.4–10.8)

## 2020-07-09 LAB — LIPID PANEL
Chol/HDL Ratio: 3.6 ratio (ref 0.0–4.4)
Cholesterol, Total: 245 mg/dL — ABNORMAL HIGH (ref 100–199)
HDL: 68 mg/dL (ref >39)
LDL Chol Calc (NIH): 156 mg/dL — ABNORMAL HIGH (ref 0–99)
Triglycerides: 122 mg/dL (ref 0–149)
VLDL Cholesterol Cal: 21 mg/dL (ref 5–40)

## 2020-07-09 LAB — VITAMIN D 25 HYDROXY (VIT D DEFICIENCY, FRACTURES): Vit D, 25-Hydroxy: 43.4 ng/mL (ref 30.0–100.0)

## 2020-07-09 LAB — HEPATITIS C ANTIBODY: Hep C Virus Ab: 0.1 s/co ratio (ref 0.0–0.9)

## 2020-07-09 LAB — MICROSCOPIC EXAMINATION
Bacteria, UA: NONE SEEN
Casts: NONE SEEN /lpf
RBC, Urine: NONE SEEN /hpf (ref 0–2)

## 2020-07-09 LAB — TSH: TSH: 3.07 u[IU]/mL (ref 0.450–4.500)

## 2020-07-12 ENCOUNTER — Other Ambulatory Visit: Payer: Self-pay | Admitting: Internal Medicine

## 2020-07-12 DIAGNOSIS — R739 Hyperglycemia, unspecified: Secondary | ICD-10-CM

## 2020-07-12 NOTE — Addendum Note (Signed)
Addended by: Quentin Ore on: 07/12/2020 05:03 PM   Modules accepted: Orders

## 2020-07-15 ENCOUNTER — Telehealth: Payer: Self-pay

## 2020-07-15 ENCOUNTER — Encounter: Payer: Self-pay | Admitting: Internal Medicine

## 2020-07-15 DIAGNOSIS — L920 Granuloma annulare: Secondary | ICD-10-CM | POA: Diagnosis not present

## 2020-07-15 DIAGNOSIS — R7303 Prediabetes: Secondary | ICD-10-CM | POA: Insufficient documentation

## 2020-07-15 DIAGNOSIS — L821 Other seborrheic keratosis: Secondary | ICD-10-CM | POA: Diagnosis not present

## 2020-07-15 DIAGNOSIS — E785 Hyperlipidemia, unspecified: Secondary | ICD-10-CM | POA: Insufficient documentation

## 2020-07-15 DIAGNOSIS — K130 Diseases of lips: Secondary | ICD-10-CM | POA: Diagnosis not present

## 2020-07-15 DIAGNOSIS — B001 Herpesviral vesicular dermatitis: Secondary | ICD-10-CM | POA: Diagnosis not present

## 2020-07-15 LAB — SPECIMEN STATUS REPORT

## 2020-07-15 LAB — HEMOGLOBIN A1C
Est. average glucose Bld gHb Est-mCnc: 120 mg/dL
Hgb A1c MFr Bld: 5.8 % — ABNORMAL HIGH (ref 4.8–5.6)

## 2020-07-15 NOTE — Telephone Encounter (Signed)
LMTCB. Per Dr French Ana, needs 6 month f/u in office
